# Patient Record
Sex: Female | Born: 1935 | Race: White | Hispanic: No | Smoking: Current every day smoker
Health system: Southern US, Community
[De-identification: ages and names within clinical notes are randomized; demographics above are authoritative.]

## PROBLEM LIST (undated history)

## (undated) DIAGNOSIS — I10 Essential (primary) hypertension: Secondary | ICD-10-CM

## (undated) DIAGNOSIS — S7291XA Unspecified fracture of right femur, initial encounter for closed fracture: Secondary | ICD-10-CM

## (undated) DIAGNOSIS — R1310 Dysphagia, unspecified: Secondary | ICD-10-CM

## (undated) DIAGNOSIS — R251 Tremor, unspecified: Secondary | ICD-10-CM

## (undated) DIAGNOSIS — J189 Pneumonia, unspecified organism: Secondary | ICD-10-CM

## (undated) DIAGNOSIS — E46 Unspecified protein-calorie malnutrition: Secondary | ICD-10-CM

## (undated) DIAGNOSIS — K5900 Constipation, unspecified: Secondary | ICD-10-CM

## (undated) DIAGNOSIS — E876 Hypokalemia: Secondary | ICD-10-CM

## (undated) DIAGNOSIS — K219 Gastro-esophageal reflux disease without esophagitis: Secondary | ICD-10-CM

## (undated) DIAGNOSIS — J449 Chronic obstructive pulmonary disease, unspecified: Secondary | ICD-10-CM

## (undated) DIAGNOSIS — J9600 Acute respiratory failure, unspecified whether with hypoxia or hypercapnia: Secondary | ICD-10-CM

## (undated) DIAGNOSIS — C50919 Malignant neoplasm of unspecified site of unspecified female breast: Secondary | ICD-10-CM

## (undated) DIAGNOSIS — E785 Hyperlipidemia, unspecified: Secondary | ICD-10-CM

## (undated) DIAGNOSIS — R609 Edema, unspecified: Secondary | ICD-10-CM

## (undated) DIAGNOSIS — D689 Coagulation defect, unspecified: Secondary | ICD-10-CM

## (undated) DIAGNOSIS — M419 Scoliosis, unspecified: Secondary | ICD-10-CM

## (undated) HISTORY — DX: Chronic obstructive pulmonary disease, unspecified: J44.9

## (undated) HISTORY — PX: PORTACATH PLACEMENT: SHX2246

## (undated) HISTORY — DX: Edema, unspecified: R60.9

## (undated) HISTORY — DX: Scoliosis, unspecified: M41.9

## (undated) HISTORY — DX: Dysphagia, unspecified: R13.10

## (undated) HISTORY — DX: Hyperlipidemia, unspecified: E78.5

## (undated) HISTORY — DX: Hypokalemia: E87.6

## (undated) HISTORY — DX: Hypomagnesemia: E83.42

## (undated) HISTORY — DX: Essential (primary) hypertension: I10

## (undated) HISTORY — DX: Unspecified fracture of right femur, initial encounter for closed fracture: S72.91XA

## (undated) HISTORY — DX: Coagulation defect, unspecified: D68.9

## (undated) HISTORY — DX: Tremor, unspecified: R25.1

## (undated) HISTORY — PX: OTHER SURGICAL HISTORY: SHX169

## (undated) HISTORY — PX: BACK SURGERY: SHX140

## (undated) HISTORY — DX: Acute respiratory failure, unspecified whether with hypoxia or hypercapnia: J96.00

## (undated) HISTORY — DX: Constipation, unspecified: K59.00

## (undated) HISTORY — DX: Malignant neoplasm of unspecified site of unspecified female breast: C50.919

## (undated) HISTORY — DX: Gastro-esophageal reflux disease without esophagitis: K21.9

## (undated) HISTORY — DX: Pneumonia, unspecified organism: J18.9

## (undated) HISTORY — DX: Unspecified protein-calorie malnutrition: E46

---

## 1997-09-19 DIAGNOSIS — D689 Coagulation defect, unspecified: Secondary | ICD-10-CM

## 1997-09-19 HISTORY — DX: Coagulation defect, unspecified: D68.9

## 1999-09-20 DIAGNOSIS — C50919 Malignant neoplasm of unspecified site of unspecified female breast: Secondary | ICD-10-CM

## 1999-09-20 HISTORY — DX: Malignant neoplasm of unspecified site of unspecified female breast: C50.919

## 2003-09-20 HISTORY — PX: BACK SURGERY: SHX140

## 2003-11-26 ENCOUNTER — Ambulatory Visit (HOSPITAL_COMMUNITY): Admission: RE | Admit: 2003-11-26 | Discharge: 2003-11-27 | Payer: Self-pay | Admitting: *Deleted

## 2004-06-19 ENCOUNTER — Ambulatory Visit: Payer: Self-pay | Admitting: Internal Medicine

## 2004-07-13 ENCOUNTER — Ambulatory Visit: Payer: Self-pay | Admitting: Orthopedic Surgery

## 2004-08-18 ENCOUNTER — Ambulatory Visit: Payer: Self-pay | Admitting: Internal Medicine

## 2004-10-04 ENCOUNTER — Ambulatory Visit: Payer: Self-pay | Admitting: Internal Medicine

## 2004-10-20 ENCOUNTER — Ambulatory Visit: Payer: Self-pay | Admitting: Internal Medicine

## 2004-11-17 ENCOUNTER — Ambulatory Visit: Payer: Self-pay | Admitting: Internal Medicine

## 2004-12-18 ENCOUNTER — Ambulatory Visit: Payer: Self-pay | Admitting: Internal Medicine

## 2005-01-24 ENCOUNTER — Ambulatory Visit: Payer: Self-pay | Admitting: Internal Medicine

## 2005-02-01 ENCOUNTER — Ambulatory Visit: Payer: Self-pay | Admitting: Unknown Physician Specialty

## 2005-02-07 ENCOUNTER — Ambulatory Visit: Payer: Self-pay | Admitting: General Surgery

## 2005-02-17 ENCOUNTER — Ambulatory Visit: Payer: Self-pay | Admitting: Internal Medicine

## 2005-03-19 ENCOUNTER — Ambulatory Visit: Payer: Self-pay | Admitting: Internal Medicine

## 2005-04-19 ENCOUNTER — Ambulatory Visit: Payer: Self-pay | Admitting: Internal Medicine

## 2005-07-19 ENCOUNTER — Ambulatory Visit: Payer: Self-pay | Admitting: Internal Medicine

## 2005-11-15 ENCOUNTER — Ambulatory Visit: Payer: Self-pay | Admitting: Internal Medicine

## 2005-11-17 ENCOUNTER — Ambulatory Visit: Payer: Self-pay | Admitting: Internal Medicine

## 2006-02-13 ENCOUNTER — Ambulatory Visit: Payer: Self-pay | Admitting: Internal Medicine

## 2006-02-17 ENCOUNTER — Ambulatory Visit: Payer: Self-pay | Admitting: Internal Medicine

## 2006-03-19 ENCOUNTER — Ambulatory Visit: Payer: Self-pay | Admitting: Internal Medicine

## 2006-04-03 ENCOUNTER — Ambulatory Visit: Payer: Self-pay | Admitting: Unknown Physician Specialty

## 2006-04-19 ENCOUNTER — Ambulatory Visit: Payer: Self-pay | Admitting: Internal Medicine

## 2006-05-20 ENCOUNTER — Ambulatory Visit: Payer: Self-pay | Admitting: Internal Medicine

## 2006-06-14 ENCOUNTER — Ambulatory Visit: Payer: Self-pay

## 2006-06-19 ENCOUNTER — Ambulatory Visit: Payer: Self-pay | Admitting: Internal Medicine

## 2006-06-22 ENCOUNTER — Ambulatory Visit: Payer: Self-pay | Admitting: Gastroenterology

## 2006-08-25 ENCOUNTER — Ambulatory Visit: Payer: Self-pay | Admitting: Internal Medicine

## 2006-09-19 ENCOUNTER — Ambulatory Visit: Payer: Self-pay | Admitting: Internal Medicine

## 2006-10-20 ENCOUNTER — Ambulatory Visit: Payer: Self-pay | Admitting: Internal Medicine

## 2006-10-26 ENCOUNTER — Ambulatory Visit: Payer: Self-pay

## 2006-11-15 ENCOUNTER — Ambulatory Visit: Payer: Self-pay | Admitting: Dermatology

## 2006-11-15 ENCOUNTER — Ambulatory Visit: Payer: Self-pay

## 2006-11-18 ENCOUNTER — Ambulatory Visit: Payer: Self-pay | Admitting: Internal Medicine

## 2007-01-18 ENCOUNTER — Ambulatory Visit: Payer: Self-pay | Admitting: Internal Medicine

## 2007-02-09 ENCOUNTER — Ambulatory Visit: Payer: Self-pay | Admitting: Internal Medicine

## 2007-02-18 ENCOUNTER — Ambulatory Visit: Payer: Self-pay | Admitting: Internal Medicine

## 2007-06-18 ENCOUNTER — Ambulatory Visit: Payer: Self-pay | Admitting: Unknown Physician Specialty

## 2007-07-02 ENCOUNTER — Ambulatory Visit: Payer: Self-pay | Admitting: Unknown Physician Specialty

## 2007-07-21 ENCOUNTER — Ambulatory Visit: Payer: Self-pay | Admitting: Internal Medicine

## 2007-08-09 ENCOUNTER — Ambulatory Visit: Payer: Self-pay | Admitting: Internal Medicine

## 2007-08-20 ENCOUNTER — Ambulatory Visit: Payer: Self-pay | Admitting: Internal Medicine

## 2007-10-10 ENCOUNTER — Ambulatory Visit: Payer: Self-pay | Admitting: Internal Medicine

## 2007-10-21 ENCOUNTER — Ambulatory Visit: Payer: Self-pay | Admitting: Internal Medicine

## 2007-12-03 ENCOUNTER — Ambulatory Visit: Payer: Self-pay | Admitting: Internal Medicine

## 2007-12-19 ENCOUNTER — Ambulatory Visit: Payer: Self-pay | Admitting: Internal Medicine

## 2008-02-18 ENCOUNTER — Ambulatory Visit: Payer: Self-pay | Admitting: Internal Medicine

## 2008-03-03 ENCOUNTER — Ambulatory Visit: Payer: Self-pay | Admitting: Internal Medicine

## 2008-03-19 ENCOUNTER — Ambulatory Visit: Payer: Self-pay | Admitting: Internal Medicine

## 2008-03-20 ENCOUNTER — Ambulatory Visit: Payer: Self-pay | Admitting: Orthopaedic Surgery

## 2008-04-19 ENCOUNTER — Ambulatory Visit: Payer: Self-pay | Admitting: Internal Medicine

## 2008-06-23 ENCOUNTER — Ambulatory Visit: Payer: Self-pay | Admitting: Unknown Physician Specialty

## 2008-08-19 ENCOUNTER — Ambulatory Visit: Payer: Self-pay | Admitting: Internal Medicine

## 2008-09-09 ENCOUNTER — Ambulatory Visit: Payer: Self-pay | Admitting: Internal Medicine

## 2008-09-19 ENCOUNTER — Ambulatory Visit: Payer: Self-pay | Admitting: Internal Medicine

## 2008-10-20 ENCOUNTER — Ambulatory Visit: Payer: Self-pay | Admitting: Internal Medicine

## 2008-11-03 ENCOUNTER — Encounter: Admission: RE | Admit: 2008-11-03 | Discharge: 2008-11-03 | Payer: Self-pay | Admitting: Orthopaedic Surgery

## 2008-11-17 ENCOUNTER — Ambulatory Visit: Payer: Self-pay | Admitting: Internal Medicine

## 2008-12-18 ENCOUNTER — Ambulatory Visit: Payer: Self-pay | Admitting: Internal Medicine

## 2009-03-03 ENCOUNTER — Ambulatory Visit: Payer: Self-pay | Admitting: Internal Medicine

## 2009-03-13 ENCOUNTER — Ambulatory Visit: Payer: Self-pay | Admitting: Orthopaedic Surgery

## 2009-03-19 ENCOUNTER — Ambulatory Visit: Payer: Self-pay | Admitting: Internal Medicine

## 2009-05-20 ENCOUNTER — Ambulatory Visit: Payer: Self-pay | Admitting: Internal Medicine

## 2009-05-26 ENCOUNTER — Ambulatory Visit: Payer: Self-pay | Admitting: Internal Medicine

## 2009-06-19 ENCOUNTER — Ambulatory Visit: Payer: Self-pay | Admitting: Internal Medicine

## 2009-08-03 ENCOUNTER — Ambulatory Visit: Payer: Self-pay | Admitting: Unknown Physician Specialty

## 2009-11-17 ENCOUNTER — Ambulatory Visit: Payer: Self-pay | Admitting: Internal Medicine

## 2009-11-24 ENCOUNTER — Ambulatory Visit: Payer: Self-pay | Admitting: Internal Medicine

## 2009-12-18 ENCOUNTER — Ambulatory Visit: Payer: Self-pay | Admitting: Internal Medicine

## 2010-01-17 ENCOUNTER — Ambulatory Visit: Payer: Self-pay | Admitting: Internal Medicine

## 2010-05-25 ENCOUNTER — Ambulatory Visit: Payer: Self-pay | Admitting: Internal Medicine

## 2010-06-19 ENCOUNTER — Ambulatory Visit: Payer: Self-pay | Admitting: Internal Medicine

## 2010-08-11 ENCOUNTER — Ambulatory Visit: Payer: Self-pay | Admitting: Unknown Physician Specialty

## 2010-12-02 ENCOUNTER — Ambulatory Visit: Payer: Self-pay | Admitting: Internal Medicine

## 2010-12-19 ENCOUNTER — Ambulatory Visit: Payer: Self-pay | Admitting: Internal Medicine

## 2011-01-18 ENCOUNTER — Ambulatory Visit: Payer: Self-pay | Admitting: Internal Medicine

## 2011-03-04 ENCOUNTER — Ambulatory Visit: Payer: Self-pay | Admitting: Internal Medicine

## 2011-03-15 ENCOUNTER — Inpatient Hospital Stay: Payer: Self-pay | Admitting: Internal Medicine

## 2011-03-20 ENCOUNTER — Ambulatory Visit: Payer: Self-pay | Admitting: Internal Medicine

## 2011-04-20 ENCOUNTER — Ambulatory Visit: Payer: Self-pay | Admitting: Internal Medicine

## 2011-05-21 ENCOUNTER — Ambulatory Visit: Payer: Self-pay | Admitting: Internal Medicine

## 2011-06-20 ENCOUNTER — Ambulatory Visit: Payer: Self-pay | Admitting: Internal Medicine

## 2011-07-21 ENCOUNTER — Ambulatory Visit: Payer: Self-pay | Admitting: Internal Medicine

## 2011-08-24 ENCOUNTER — Ambulatory Visit: Payer: Self-pay | Admitting: Internal Medicine

## 2011-09-20 ENCOUNTER — Ambulatory Visit: Payer: Self-pay | Admitting: Internal Medicine

## 2011-10-24 ENCOUNTER — Ambulatory Visit: Payer: Self-pay | Admitting: Unknown Physician Specialty

## 2011-10-27 ENCOUNTER — Ambulatory Visit: Payer: Self-pay | Admitting: Internal Medicine

## 2011-10-27 LAB — CBC CANCER CENTER
Eosinophil #: 0.5 x10 3/mm (ref 0.0–0.7)
Eosinophil %: 5.4 %
HCT: 40.4 % (ref 35.0–47.0)
HGB: 13.9 g/dL (ref 12.0–16.0)
MCHC: 34.3 g/dL (ref 32.0–36.0)
MCV: 104 fL — ABNORMAL HIGH (ref 80–100)
Monocyte #: 0.9 x10 3/mm — ABNORMAL HIGH (ref 0.0–0.7)
Neutrophil %: 56.8 %
Platelet: 638 x10 3/mm — ABNORMAL HIGH (ref 150–440)
RBC: 3.91 10*6/uL (ref 3.80–5.20)
WBC: 8.5 x10 3/mm (ref 3.6–11.0)

## 2011-11-18 ENCOUNTER — Ambulatory Visit: Payer: Self-pay | Admitting: Internal Medicine

## 2011-11-30 LAB — CBC CANCER CENTER
Basophil #: 0 x10 3/mm (ref 0.0–0.1)
Basophil %: 0.4 %
Eosinophil #: 0.3 x10 3/mm (ref 0.0–0.7)
Eosinophil %: 2.1 %
HCT: 38.6 % (ref 35.0–47.0)
HGB: 13.5 g/dL (ref 12.0–16.0)
Lymphocyte #: 1.9 x10 3/mm (ref 1.0–3.6)
MCH: 35.8 pg — ABNORMAL HIGH (ref 26.0–34.0)
MCHC: 34.9 g/dL (ref 32.0–36.0)
MCV: 103 fL — ABNORMAL HIGH (ref 80–100)
Monocyte %: 8.1 %
Neutrophil #: 9.4 x10 3/mm — ABNORMAL HIGH (ref 1.4–6.5)
Neutrophil %: 74.5 %
RBC: 3.76 10*6/uL — ABNORMAL LOW (ref 3.80–5.20)

## 2011-12-19 ENCOUNTER — Ambulatory Visit: Payer: Self-pay | Admitting: Internal Medicine

## 2012-01-06 LAB — CBC CANCER CENTER
Basophil #: 0.2 x10 3/mm — ABNORMAL HIGH (ref 0.0–0.1)
Eosinophil #: 0.3 x10 3/mm (ref 0.0–0.7)
HCT: 39.1 % (ref 35.0–47.0)
HGB: 13.3 g/dL (ref 12.0–16.0)
Lymphocyte #: 2.2 x10 3/mm (ref 1.0–3.6)
MCV: 104 fL — ABNORMAL HIGH (ref 80–100)
Monocyte #: 1 x10 3/mm — ABNORMAL HIGH (ref 0.2–0.9)
Monocyte %: 9.1 %
RBC: 3.76 10*6/uL — ABNORMAL LOW (ref 3.80–5.20)
RDW: 13.7 % (ref 11.5–14.5)
WBC: 10.8 x10 3/mm (ref 3.6–11.0)

## 2012-01-06 LAB — CREATININE, SERUM
Creatinine: 1.28 mg/dL (ref 0.60–1.30)
EGFR (African American): 48 — ABNORMAL LOW
EGFR (Non-African Amer.): 41 — ABNORMAL LOW

## 2012-01-18 ENCOUNTER — Ambulatory Visit: Payer: Self-pay | Admitting: Internal Medicine

## 2012-01-26 LAB — CBC CANCER CENTER
Basophil %: 0.9 %
Eosinophil #: 0.2 x10 3/mm (ref 0.0–0.7)
Eosinophil %: 2.7 %
Lymphocyte #: 2 x10 3/mm (ref 1.0–3.6)
Lymphocyte %: 22 %
MCH: 35.5 pg — ABNORMAL HIGH (ref 26.0–34.0)
MCHC: 33.8 g/dL (ref 32.0–36.0)
Monocyte #: 1 x10 3/mm — ABNORMAL HIGH (ref 0.2–0.9)
Monocyte %: 11.7 %
Neutrophil #: 5.6 x10 3/mm (ref 1.4–6.5)
Platelet: 440 x10 3/mm (ref 150–440)
RBC: 3.7 10*6/uL — ABNORMAL LOW (ref 3.80–5.20)
WBC: 8.9 x10 3/mm (ref 3.6–11.0)

## 2012-01-26 LAB — CREATININE, SERUM
Creatinine: 1.08 mg/dL (ref 0.60–1.30)
EGFR (African American): 59 — ABNORMAL LOW
EGFR (Non-African Amer.): 51 — ABNORMAL LOW

## 2012-02-18 ENCOUNTER — Ambulatory Visit: Payer: Self-pay | Admitting: Internal Medicine

## 2012-02-24 LAB — CBC CANCER CENTER
Basophil #: 0.1 10*3/uL
Basophil %: 1 %
Eosinophil #: 0.3 10*3/uL
Eosinophil %: 2.8 %
HCT: 39.5 %
HGB: 13.8 g/dL
Lymphocyte %: 24.5 %
Lymphs Abs: 2.6 10*3/uL
MCH: 36.4 pg — ABNORMAL HIGH
MCHC: 35 g/dL
MCV: 104 fL — ABNORMAL HIGH
Monocyte #: 1 10*3/uL — ABNORMAL HIGH
Monocyte %: 9.6 %
Neutrophil #: 6.5 10*3/uL
Neutrophil %: 62.1 %
Platelet: 583 10*3/uL — ABNORMAL HIGH
RBC: 3.8 10*6/uL
RDW: 13.4 %
WBC: 10.5 10*3/uL

## 2012-03-19 ENCOUNTER — Ambulatory Visit: Payer: Self-pay | Admitting: Internal Medicine

## 2012-04-18 LAB — CBC CANCER CENTER
Basophil #: 0.1 x10 3/mm (ref 0.0–0.1)
Basophil %: 1 %
Eosinophil %: 3.4 %
HCT: 37.7 % (ref 35.0–47.0)
HGB: 13.2 g/dL (ref 12.0–16.0)
Lymphocyte #: 2.5 x10 3/mm (ref 1.0–3.6)
MCHC: 35.1 g/dL (ref 32.0–36.0)
MCV: 103 fL — ABNORMAL HIGH (ref 80–100)
Monocyte %: 9.8 %
Neutrophil #: 6.9 x10 3/mm — ABNORMAL HIGH (ref 1.4–6.5)
Neutrophil %: 62.9 %
RDW: 13 % (ref 11.5–14.5)

## 2012-04-19 ENCOUNTER — Ambulatory Visit: Payer: Self-pay | Admitting: Internal Medicine

## 2012-05-09 LAB — CBC CANCER CENTER
Basophil #: 0.1 x10 3/mm (ref 0.0–0.1)
Basophil %: 1.4 %
HGB: 13.2 g/dL (ref 12.0–16.0)
Lymphocyte #: 2.6 x10 3/mm (ref 1.0–3.6)
Lymphocyte %: 25 %
MCH: 35 pg — ABNORMAL HIGH (ref 26.0–34.0)
MCHC: 33.9 g/dL (ref 32.0–36.0)
MCV: 103 fL — ABNORMAL HIGH (ref 80–100)
Monocyte #: 1.1 x10 3/mm — ABNORMAL HIGH (ref 0.2–0.9)
Neutrophil #: 6.2 x10 3/mm (ref 1.4–6.5)
Neutrophil %: 60.2 %
RBC: 3.76 10*6/uL — ABNORMAL LOW (ref 3.80–5.20)
RDW: 13.2 % (ref 11.5–14.5)

## 2012-05-10 LAB — URINE IEP, RANDOM

## 2012-05-20 ENCOUNTER — Ambulatory Visit: Payer: Self-pay | Admitting: Internal Medicine

## 2012-06-13 LAB — CANCER CTR PLATELET CT: Platelet: 471 x10 3/mm — ABNORMAL HIGH (ref 150–440)

## 2012-06-19 ENCOUNTER — Ambulatory Visit: Payer: Self-pay | Admitting: Internal Medicine

## 2012-08-08 ENCOUNTER — Ambulatory Visit: Payer: Self-pay | Admitting: Internal Medicine

## 2012-08-08 LAB — CBC CANCER CENTER
Basophil %: 1.1 %
Eosinophil %: 3.9 %
HGB: 13.2 g/dL (ref 12.0–16.0)
Lymphocyte #: 2.1 x10 3/mm (ref 1.0–3.6)
MCV: 104 fL — ABNORMAL HIGH (ref 80–100)
Monocyte #: 1 x10 3/mm — ABNORMAL HIGH (ref 0.2–0.9)
Monocyte %: 9.4 %
Neutrophil #: 6.6 x10 3/mm — ABNORMAL HIGH (ref 1.4–6.5)
Neutrophil %: 65.4 %
Platelet: 393 x10 3/mm (ref 150–440)
RBC: 3.7 10*6/uL — ABNORMAL LOW (ref 3.80–5.20)
WBC: 10.2 x10 3/mm (ref 3.6–11.0)

## 2012-08-19 ENCOUNTER — Ambulatory Visit: Payer: Self-pay | Admitting: Internal Medicine

## 2012-09-19 DIAGNOSIS — C50919 Malignant neoplasm of unspecified site of unspecified female breast: Secondary | ICD-10-CM

## 2012-09-19 HISTORY — DX: Malignant neoplasm of unspecified site of unspecified female breast: C50.919

## 2012-09-19 HISTORY — PX: BREAST BIOPSY: SHX20

## 2012-10-24 ENCOUNTER — Ambulatory Visit: Payer: Self-pay | Admitting: Internal Medicine

## 2012-10-24 LAB — CBC CANCER CENTER
Basophil #: 0.1 x10 3/mm (ref 0.0–0.1)
Basophil %: 1.1 %
Eosinophil #: 0.3 x10 3/mm (ref 0.0–0.7)
Eosinophil %: 3.5 %
Lymphocyte #: 1.8 x10 3/mm (ref 1.0–3.6)
Lymphocyte %: 19.9 %
MCH: 35.9 pg — ABNORMAL HIGH (ref 26.0–34.0)
MCV: 101 fL — ABNORMAL HIGH (ref 80–100)
Neutrophil #: 6.2 x10 3/mm (ref 1.4–6.5)
Neutrophil %: 66.5 %
RBC: 3.83 10*6/uL (ref 3.80–5.20)
RDW: 13.1 % (ref 11.5–14.5)
WBC: 9.3 x10 3/mm (ref 3.6–11.0)

## 2012-10-24 LAB — COMPREHENSIVE METABOLIC PANEL
Albumin: 3.9 g/dL (ref 3.4–5.0)
Alkaline Phosphatase: 133 U/L (ref 50–136)
Anion Gap: 6 — ABNORMAL LOW (ref 7–16)
BUN: 20 mg/dL — ABNORMAL HIGH (ref 7–18)
Co2: 29 mmol/L (ref 21–32)
Creatinine: 1.14 mg/dL (ref 0.60–1.30)
EGFR (African American): 55 — ABNORMAL LOW
EGFR (Non-African Amer.): 47 — ABNORMAL LOW
Glucose: 102 mg/dL — ABNORMAL HIGH (ref 65–99)
SGPT (ALT): 26 U/L (ref 12–78)
Sodium: 128 mmol/L — ABNORMAL LOW (ref 136–145)
Total Protein: 7.4 g/dL (ref 6.4–8.2)

## 2012-10-25 ENCOUNTER — Ambulatory Visit: Payer: Self-pay | Admitting: Unknown Physician Specialty

## 2012-10-25 ENCOUNTER — Ambulatory Visit: Payer: Self-pay | Admitting: Internal Medicine

## 2012-10-26 ENCOUNTER — Ambulatory Visit: Payer: Self-pay | Admitting: Unknown Physician Specialty

## 2012-10-26 LAB — CREATININE, SERUM
EGFR (African American): 50 — ABNORMAL LOW
EGFR (Non-African Amer.): 43 — ABNORMAL LOW

## 2012-10-29 LAB — BASIC METABOLIC PANEL
BUN: 16 mg/dL (ref 7–18)
Chloride: 90 mmol/L — ABNORMAL LOW (ref 98–107)
Co2: 30 mmol/L (ref 21–32)
Creatinine: 1.24 mg/dL (ref 0.60–1.30)
EGFR (African American): 50 — ABNORMAL LOW
Glucose: 88 mg/dL (ref 65–99)
Osmolality: 258 (ref 275–301)
Potassium: 3.4 mmol/L — ABNORMAL LOW (ref 3.5–5.1)
Sodium: 128 mmol/L — ABNORMAL LOW (ref 136–145)

## 2012-11-06 ENCOUNTER — Ambulatory Visit: Payer: Self-pay | Admitting: Internal Medicine

## 2012-11-08 LAB — CREATININE, SERUM
Creatinine: 1.22 mg/dL (ref 0.60–1.30)
EGFR (African American): 51 — ABNORMAL LOW
EGFR (Non-African Amer.): 44 — ABNORMAL LOW

## 2012-11-08 LAB — PROTIME-INR
INR: 0.9
Prothrombin Time: 12.7 secs (ref 11.5–14.7)

## 2012-11-08 LAB — APTT: Activated PTT: 33.5 secs (ref 23.6–35.9)

## 2012-11-17 ENCOUNTER — Ambulatory Visit: Payer: Self-pay | Admitting: Internal Medicine

## 2012-11-20 ENCOUNTER — Ambulatory Visit: Payer: Self-pay | Admitting: Internal Medicine

## 2012-11-20 LAB — PLATELET COUNT: Platelet: 551 10*3/uL — ABNORMAL HIGH (ref 150–440)

## 2012-11-21 ENCOUNTER — Ambulatory Visit: Payer: Self-pay | Admitting: Internal Medicine

## 2012-12-06 ENCOUNTER — Other Ambulatory Visit (INDEPENDENT_AMBULATORY_CARE_PROVIDER_SITE_OTHER): Payer: Self-pay

## 2012-12-06 ENCOUNTER — Encounter: Payer: Self-pay | Admitting: General Surgery

## 2012-12-06 ENCOUNTER — Ambulatory Visit (INDEPENDENT_AMBULATORY_CARE_PROVIDER_SITE_OTHER): Payer: Medicare Other | Admitting: General Surgery

## 2012-12-06 VITALS — BP 146/70 | HR 72 | Resp 12 | Ht 64.0 in | Wt 132.0 lb

## 2012-12-06 DIAGNOSIS — N6320 Unspecified lump in the left breast, unspecified quadrant: Secondary | ICD-10-CM

## 2012-12-06 DIAGNOSIS — N63 Unspecified lump in unspecified breast: Secondary | ICD-10-CM

## 2012-12-06 NOTE — Progress Notes (Addendum)
Patient ID: Christine Strickland, female   DOB: 1935-09-21, 77 y.o.   MRN: [id number]  Chief Complaint  Patient presents with  . Breast Pain    and breast lump    HPI Christine Strickland is a 77 y.o. female.   HPI Patient here today for evaluation of her left breast mass and left neck mass discovered by Dr Neale Burly at least 2 months ago.  PET scan and CXR done as well as axillary biopsy. The patient had noticed a mass in the left side of the neck. She was evaluated by Erline Hau, M.D. Of the ENT Department. She was subtotally referred to Wyatt Haste,  M.D.from medical oncology. A PET CT was obtained that showed high level activity in the upper outer quadrant of the left breast, left axilla as well as the index mass in the left supraclavicular area. She underwent a biopsy of the axilla on November 20, 2012 with findings of metastatic  Carcinoma, but insignificant tissue to make an diagnosis. An invasive mammary carcinoma of no specific type was suspected. The patient is seen today to discuss having a biopsy of the breast mass  To establish a tissue diagnosis. She was accompanied for the initial visit by her 2 sons, [name] and [name].  Past Medical History  Diagnosis Date  . Breast cancer 2001  . Clotting disorder 1999    platlets  . Scoliosis     Past Surgical History  Procedure Laterality Date  . Breast surgery    . Back surgery  2005  . Back surgery      History reviewed. No pertinent family history.  Social History History  Substance Use Topics  . Smoking status: Current Every Day Smoker -- 0.05 packs/day for 10 years  . Smokeless tobacco: Never Used  . Alcohol Use: 8.4 oz/week    14 Glasses of wine per week    No Known Allergies  Current Outpatient Prescriptions  Medication Sig Dispense Refill  . amLODipine (NORVASC) 5 MG tablet Take 1 tablet by mouth daily.      Marland Kitchen anagrelide (AGRYLIN) 0.5 MG capsule Take 0.5 mg by mouth daily.      . Ascorbic Acid (VITAMIN C PO) Take 1 tablet by mouth daily.       . Biotin 1000 MCG tablet Take 1,000 mcg by mouth daily.      . CELEBREX 200 MG capsule Take 1 capsule by mouth daily.      . metoprolol succinate (TOPROL-XL) 50 MG 24 hr tablet Take 1 tablet by mouth daily.      . traMADol (ULTRAM) 50 MG tablet Take 1 tablet by mouth daily.      . vitamin E (VITAMIN E) 400 UNIT capsule Take 400 Units by mouth daily.       No current facility-administered medications for this visit.    Review of Systems Review of Systems  Constitutional: Negative.   Respiratory: Negative.   Cardiovascular: Negative.   She does complain of some left neck "soreness" but denies breast pain.  Denies nipple drainage.  Blood pressure 146/70, pulse 72, resp. rate 12, height 5\' 4"  (1.626 m), weight 132 lb (59.875 kg).  Physical Exam Physical Exam  Constitutional: She is oriented to person, place, and time. She appears well-developed.  Cardiovascular: Normal rate and regular rhythm.   Pulmonary/Chest: Effort normal and breath sounds normal. Right breast exhibits no inverted nipple, no nipple discharge, no skin change and no tenderness. Left breast exhibits mass. Left breast exhibits no inverted nipple  and no tenderness.    Lymphadenopathy:    She has axillary adenopathy.       Left: Supraclavicular adenopathy present.  Neurological: She is alert and oriented to person, place, and time.  Skin: Skin is warm and dry.  Right breast scaring upper outer quadrant. 2 cm upper outer quadrant left breast.   Data Reviewed Pathology from the patient's right breast cancer showed a lobular carcinoma 3 cm in diameter, node negative, ER positive.  PET/CT of November 06, 2012 was reviewed. Platelet count November 20, 2012 was 551,000 with normal coagulation studies obtained February 20, creatinine 1.22 with an estimated GFR of 44 the same date. Normal CA 27-29. Mild hyponatremia hyperal K. Nedra Hai me appeared  Ultrasound examination of the upper outer quadrant of the left breast showed a  hypoechoic mass that was taller than wide measuring 0.9 x 1.1 x 1.2 cm. Some acoustic shadowing was noted.  The patient was amenable to biopsy. This was completed using 10 cc of 0.5% Xylocaine with 0.25% Marcaine with 1-200,000 of epinephrine. A 10-gauge Encor device was used and a core samples obtained. No bleeding was noted. The skin defect was closed with benzoin and Steri-Strips followed by Telfa and Tegaderm dressing.  Assessment    Metastatic breast cancer.     Plan    The patient will be contacted when the pathology is available.        Earline Mayotte 12/07/2012, 12:25 PM

## 2012-12-06 NOTE — Patient Instructions (Addendum)

## 2012-12-07 NOTE — Procedures (Signed)
Ultrasound examination of the upper outer quadrant of the left breast showed a hypoechoic mass that was taller than wide measuring 0.9 x 1.1 x 1.2 cm. Some acoustic shadowing was noted.  The patient was amenable to biopsy. This was completed using 10 cc of 0.5% Xylocaine with 0.25% Marcaine with 1-200,000 of epinephrine. A 10-gauge Encor device was used and a core samples obtained. No bleeding was noted. The skin defect was closed with benzoin and Steri-Strips followed by Telfa and Tegaderm dressing.

## 2012-12-07 NOTE — Addendum Note (Signed)
Addended by: Earline Mayotte on: 12/07/2012 12:26 PM   Modules accepted: Orders

## 2012-12-13 ENCOUNTER — Ambulatory Visit (INDEPENDENT_AMBULATORY_CARE_PROVIDER_SITE_OTHER): Payer: Medicare Other | Admitting: *Deleted

## 2012-12-13 DIAGNOSIS — N63 Unspecified lump in unspecified breast: Secondary | ICD-10-CM

## 2012-12-13 NOTE — Progress Notes (Signed)
Pathology reviewed:   PATH REPORT.SITE OF ORIGIN SPEC  Comment   Comments: Material submitted: Marland Kitchen LEFT BREAST 2:00 *STAT* PATH REPORT.FINAL DX SPEC  Comment   Comments: Clinician provided ICD-9: 611.72 ; Lump or mass in breast PATH REPORT.FINAL DX SPEC  Comment   Comments:   Diagnosis: LEFT BREAST 2:00 *STAT*: - INVASIVE MAMMARY CARCINOMA, NO SPECIAL TYPE, WITH CANCERIZATION OF LOBULES, SEE COMMENT. Comment: The largest linear focus of invasive carcinoma measures 0.3 cm in this material. Although the final grade should be assigned on the excision specimen, the findings correspond to a Nottingham grade of 3 (tubule formation score 3, nuclear pleomorphism score 3, mitotic count score 2, total score 8/9). ER / PR will be assessed by immunohistochemistry and Her 2 FISH will be performed. Results will be reported separately. Intradepartmental consultation was obtained. The results of this case were discussed with Dr. Lemar Livings on December 10, 2012 by Dr. Excell Seltzer. XDB/12/10/2012   SIGNED OUT BY:  Comment   Comments: Electronically signed: . Gilman Schmidt, MD, Pathologist  Results have been discussed with the patient and Dr. Doylene Canning.

## 2012-12-13 NOTE — Progress Notes (Signed)
Patient here today for follow up post breast biopsy.  Dressing removed, steristrip in place and aware it may come off in one week.  Minimal bruising noted.  The patient is aware that a heating pad may be used for comfort as needed.  Aware of pathology. Follow up as scheduled.  

## 2012-12-17 ENCOUNTER — Encounter: Payer: Self-pay | Admitting: General Surgery

## 2012-12-18 ENCOUNTER — Ambulatory Visit: Payer: Self-pay | Admitting: Internal Medicine

## 2012-12-18 ENCOUNTER — Encounter: Payer: Self-pay | Admitting: General Surgery

## 2012-12-21 ENCOUNTER — Telehealth: Payer: Self-pay | Admitting: *Deleted

## 2012-12-21 NOTE — Telephone Encounter (Signed)
Collette at the [facility] called and said Dr. Neale Burly wants to get patient set up for a port placement as soon as possible. Will you need to see the patient pre-op or should I just go ahead and arrange? Thanks.

## 2012-12-24 NOTE — Telephone Encounter (Signed)
Per Dr. Lemar Livings, we can go ahead and arrange for port placement. This has been scheduled at [facility] for 12-27-12. She is aware of instructions. Also, Collette at the [facility] is aware of date.

## 2012-12-25 ENCOUNTER — Other Ambulatory Visit: Payer: Self-pay | Admitting: General Surgery

## 2012-12-25 DIAGNOSIS — C50912 Malignant neoplasm of unspecified site of left female breast: Secondary | ICD-10-CM

## 2012-12-25 NOTE — Telephone Encounter (Signed)
The power port procedure was discussed with the patient by phone. Complications (pneumothorax, vascular injury, infection, etc) reviewed.  Patient comfortable to proceed with power port placement. With extensive left sided tumor, will plan for right side port placement.

## 2012-12-27 ENCOUNTER — Ambulatory Visit: Payer: Self-pay | Admitting: General Surgery

## 2012-12-27 ENCOUNTER — Encounter: Payer: Self-pay | Admitting: General Surgery

## 2012-12-27 DIAGNOSIS — C50919 Malignant neoplasm of unspecified site of unspecified female breast: Secondary | ICD-10-CM

## 2012-12-31 ENCOUNTER — Encounter: Payer: Self-pay | Admitting: General Surgery

## 2013-01-01 LAB — CBC CANCER CENTER
Basophil %: 1.1 %
Eosinophil #: 0.3 x10 3/mm (ref 0.0–0.7)
Eosinophil %: 3.2 %
HCT: 34.8 % — ABNORMAL LOW (ref 35.0–47.0)
HGB: 12.2 g/dL (ref 12.0–16.0)
Lymphocyte #: 2.3 x10 3/mm (ref 1.0–3.6)
MCH: 36.6 pg — ABNORMAL HIGH (ref 26.0–34.0)
MCHC: 35 g/dL (ref 32.0–36.0)
MCV: 105 fL — ABNORMAL HIGH (ref 80–100)
Monocyte #: 0.9 x10 3/mm (ref 0.2–0.9)
Monocyte %: 9.3 %
Neutrophil #: 5.8 x10 3/mm (ref 1.4–6.5)
Neutrophil %: 62.1 %
Platelet: 415 x10 3/mm (ref 150–440)
RBC: 3.33 10*6/uL — ABNORMAL LOW (ref 3.80–5.20)
RDW: 14.4 % (ref 11.5–14.5)
WBC: 9.4 x10 3/mm (ref 3.6–11.0)

## 2013-01-01 LAB — COMPREHENSIVE METABOLIC PANEL
Albumin: 3.2 g/dL — ABNORMAL LOW (ref 3.4–5.0)
Anion Gap: 10 (ref 7–16)
Chloride: 96 mmol/L — ABNORMAL LOW (ref 98–107)
Co2: 27 mmol/L (ref 21–32)
Creatinine: 1.25 mg/dL (ref 0.60–1.30)
EGFR (Non-African Amer.): 42 — ABNORMAL LOW
Osmolality: 267 (ref 275–301)

## 2013-01-01 LAB — MAGNESIUM: Magnesium: 1.9 mg/dL

## 2013-01-07 LAB — CBC CANCER CENTER
Basophil #: 0 x10 3/mm (ref 0.0–0.1)
Basophil %: 1.5 %
Eosinophil #: 0 x10 3/mm (ref 0.0–0.7)
Eosinophil %: 3.2 %
HGB: 11.3 g/dL — ABNORMAL LOW (ref 12.0–16.0)
Lymphocyte #: 0.6 x10 3/mm — ABNORMAL LOW (ref 1.0–3.6)
MCH: 37.5 pg — ABNORMAL HIGH (ref 26.0–34.0)
MCV: 107 fL — ABNORMAL HIGH (ref 80–100)
Monocyte #: 0 x10 3/mm — ABNORMAL LOW (ref 0.2–0.9)
Neutrophil %: 29.1 %
Platelet: 149 x10 3/mm — ABNORMAL LOW (ref 150–440)
RBC: 3.01 10*6/uL — ABNORMAL LOW (ref 3.80–5.20)
RDW: 14 % (ref 11.5–14.5)

## 2013-01-07 LAB — POTASSIUM: Potassium: 4.5 mmol/L (ref 3.5–5.1)

## 2013-01-09 LAB — CBC CANCER CENTER
Basophil #: 0 x10 3/mm (ref 0.0–0.1)
Basophil %: 0 %
Eosinophil %: 6.6 %
HCT: 29.1 % — ABNORMAL LOW (ref 35.0–47.0)
HGB: 10.2 g/dL — ABNORMAL LOW (ref 12.0–16.0)
Lymphocyte #: 0.2 x10 3/mm — ABNORMAL LOW (ref 1.0–3.6)
Lymphocyte %: 77 %
MCH: 37.2 pg — ABNORMAL HIGH (ref 26.0–34.0)
MCHC: 35.1 g/dL (ref 32.0–36.0)
Monocyte %: 10 %
Neutrophil #: 0 x10 3/mm — ABNORMAL LOW (ref 1.4–6.5)
Platelet: 63 x10 3/mm — ABNORMAL LOW (ref 150–440)
RBC: 2.75 10*6/uL — ABNORMAL LOW (ref 3.80–5.20)
RDW: 13.9 % (ref 11.5–14.5)
WBC: 0.3 x10 3/mm — CL (ref 3.6–11.0)

## 2013-01-11 LAB — CBC CANCER CENTER
Bands: 17 %
Basophil: 2 %
Eosinophil: 2 %
HCT: 28.7 % — ABNORMAL LOW (ref 35.0–47.0)
HGB: 10.1 g/dL — ABNORMAL LOW (ref 12.0–16.0)
Lymphocytes: 20 %
MCH: 36.9 pg — ABNORMAL HIGH (ref 26.0–34.0)
MCHC: 35.1 g/dL (ref 32.0–36.0)
Metamyelocyte: 1 %
Platelet: 60 x10 3/mm — ABNORMAL LOW (ref 150–440)
RBC: 2.74 10*6/uL — ABNORMAL LOW (ref 3.80–5.20)
RDW: 13.6 % (ref 11.5–14.5)
WBC: 2.3 x10 3/mm — ABNORMAL LOW (ref 3.6–11.0)

## 2013-01-17 ENCOUNTER — Ambulatory Visit: Payer: Self-pay | Admitting: Internal Medicine

## 2013-01-23 LAB — COMPREHENSIVE METABOLIC PANEL
Albumin: 2.6 g/dL — ABNORMAL LOW (ref 3.4–5.0)
Alkaline Phosphatase: 93 U/L (ref 50–136)
Anion Gap: 7 (ref 7–16)
BUN: 10 mg/dL (ref 7–18)
Bilirubin,Total: <0.1 mg/dL — ABNORMAL LOW (ref 0.2–1.0)
Chloride: 101 mmol/L (ref 98–107)
Co2: 26 mmol/L (ref 21–32)
EGFR (Non-African Amer.): 52 — ABNORMAL LOW
Glucose: 103 mg/dL — ABNORMAL HIGH (ref 65–99)
Sodium: 134 mmol/L — ABNORMAL LOW (ref 136–145)
Total Protein: 5.9 g/dL — ABNORMAL LOW (ref 6.4–8.2)

## 2013-01-23 LAB — CBC CANCER CENTER
Basophil #: 0.3 x10 3/mm — ABNORMAL HIGH (ref 0.0–0.1)
Eosinophil #: 0.1 x10 3/mm (ref 0.0–0.7)
Eosinophil %: 0.7 %
HGB: 9.4 g/dL — ABNORMAL LOW (ref 12.0–16.0)
Lymphocyte #: 1.2 x10 3/mm (ref 1.0–3.6)
MCHC: 35.4 g/dL (ref 32.0–36.0)
MCV: 104 fL — ABNORMAL HIGH (ref 80–100)
Monocyte #: 1.4 x10 3/mm — ABNORMAL HIGH (ref 0.2–0.9)
Platelet: 1011 x10 3/mm — ABNORMAL HIGH (ref 150–440)
RBC: 2.57 10*6/uL — ABNORMAL LOW (ref 3.80–5.20)
RDW: 13.8 % (ref 11.5–14.5)
WBC: 9.4 x10 3/mm (ref 3.6–11.0)

## 2013-01-28 LAB — CBC CANCER CENTER
Basophil #: 0 x10 3/mm (ref 0.0–0.1)
Basophil %: 0.5 %
Eosinophil #: 0.1 x10 3/mm (ref 0.0–0.7)
Eosinophil %: 1.1 %
HCT: 26.3 % — ABNORMAL LOW (ref 35.0–47.0)
Lymphocyte #: 0.5 x10 3/mm — ABNORMAL LOW (ref 1.0–3.6)
MCH: 37 pg — ABNORMAL HIGH (ref 26.0–34.0)
MCHC: 35.2 g/dL (ref 32.0–36.0)
MCV: 105 fL — ABNORMAL HIGH (ref 80–100)
Monocyte #: 0.1 x10 3/mm — ABNORMAL LOW (ref 0.2–0.9)
Monocyte %: 0.8 %
Neutrophil %: 91.9 %
Platelet: 302 x10 3/mm (ref 150–440)
RDW: 13.8 % (ref 11.5–14.5)
WBC: 9.5 x10 3/mm (ref 3.6–11.0)

## 2013-01-31 LAB — CBC CANCER CENTER
Lymphocyte %: 54.1 %
MCH: 36.8 pg — ABNORMAL HIGH (ref 26.0–34.0)
MCV: 102 fL — ABNORMAL HIGH (ref 80–100)
Monocyte #: 0.1 x10 3/mm — ABNORMAL LOW (ref 0.2–0.9)
Platelet: 91 x10 3/mm — ABNORMAL LOW (ref 150–440)
RBC: 2.42 10*6/uL — ABNORMAL LOW (ref 3.80–5.20)
RDW: 13.3 % (ref 11.5–14.5)
WBC: 0.7 x10 3/mm — CL (ref 3.6–11.0)

## 2013-01-31 LAB — POTASSIUM: Potassium: 4 mmol/L (ref 3.5–5.1)

## 2013-02-04 LAB — CBC CANCER CENTER
Basophil #: 0.1 x10 3/mm (ref 0.0–0.1)
Basophil %: 0.8 %
HCT: 23.7 % — ABNORMAL LOW (ref 35.0–47.0)
HGB: 8.4 g/dL — ABNORMAL LOW (ref 12.0–16.0)
Lymphocyte #: 0.8 x10 3/mm — ABNORMAL LOW (ref 1.0–3.6)
Lymphocyte %: 8 %
MCV: 101 fL — ABNORMAL HIGH (ref 80–100)
Monocyte #: 1.1 x10 3/mm — ABNORMAL HIGH (ref 0.2–0.9)
Platelet: 175 x10 3/mm (ref 150–440)
RBC: 2.34 10*6/uL — ABNORMAL LOW (ref 3.80–5.20)
RDW: 13.3 % (ref 11.5–14.5)

## 2013-02-13 LAB — COMPREHENSIVE METABOLIC PANEL
Albumin: 3.1 g/dL — ABNORMAL LOW (ref 3.4–5.0)
Anion Gap: 9 (ref 7–16)
Bilirubin,Total: 0.3 mg/dL (ref 0.2–1.0)
Calcium, Total: 9 mg/dL (ref 8.5–10.1)
Chloride: 96 mmol/L — ABNORMAL LOW (ref 98–107)
Co2: 27 mmol/L (ref 21–32)
EGFR (Non-African Amer.): 49 — ABNORMAL LOW
Osmolality: 264 (ref 275–301)
SGPT (ALT): 17 U/L (ref 12–78)

## 2013-02-13 LAB — CBC CANCER CENTER
Basophil #: 0.1 x10 3/mm (ref 0.0–0.1)
Basophil %: 0.5 %
Eosinophil %: 0.7 %
HGB: 9.5 g/dL — ABNORMAL LOW (ref 12.0–16.0)
Lymphocyte #: 1.3 x10 3/mm (ref 1.0–3.6)
Lymphocyte %: 12.5 %
MCHC: 36.5 g/dL — ABNORMAL HIGH (ref 32.0–36.0)
MCV: 101 fL — ABNORMAL HIGH (ref 80–100)
Monocyte %: 14.4 %
Neutrophil %: 71.9 %
Platelet: 772 x10 3/mm — ABNORMAL HIGH (ref 150–440)
RBC: 2.59 10*6/uL — ABNORMAL LOW (ref 3.80–5.20)
RDW: 14.5 % (ref 11.5–14.5)
WBC: 10.2 x10 3/mm (ref 3.6–11.0)

## 2013-02-13 LAB — MAGNESIUM: Magnesium: 1.6 mg/dL — ABNORMAL LOW

## 2013-02-17 ENCOUNTER — Ambulatory Visit: Payer: Self-pay | Admitting: Internal Medicine

## 2013-02-18 LAB — CBC CANCER CENTER
Bands: 2 %
RBC: 2.4 10*6/uL — ABNORMAL LOW (ref 3.80–5.20)
RDW: 14.3 % (ref 11.5–14.5)
Segmented Neutrophils: 92 %
WBC: 10 x10 3/mm (ref 3.6–11.0)

## 2013-02-18 LAB — POTASSIUM: Potassium: 4 mmol/L (ref 3.5–5.1)

## 2013-02-18 LAB — MAGNESIUM: Magnesium: 1.8 mg/dL

## 2013-02-22 LAB — CBC CANCER CENTER
Basophil #: 0 x10 3/mm (ref 0.0–0.1)
Basophil %: 0 %
Eosinophil %: 15.1 %
MCHC: 36.1 g/dL — ABNORMAL HIGH (ref 32.0–36.0)
MCV: 102 fL — ABNORMAL HIGH (ref 80–100)
Monocyte #: 0 x10 3/mm — ABNORMAL LOW (ref 0.2–0.9)
Monocyte %: 2 %
RBC: 2.01 10*6/uL — ABNORMAL LOW (ref 3.80–5.20)
RDW: 13.6 % (ref 11.5–14.5)
WBC: 0.6 x10 3/mm — CL (ref 3.6–11.0)

## 2013-02-22 LAB — POTASSIUM: Potassium: 3.6 mmol/L (ref 3.5–5.1)

## 2013-02-25 LAB — CBC CANCER CENTER
Basophil #: 0.1 x10 3/mm (ref 0.0–0.1)
Basophil %: 2 %
Eosinophil #: 0.1 x10 3/mm (ref 0.0–0.7)
Lymphocyte #: 0.5 x10 3/mm — ABNORMAL LOW (ref 1.0–3.6)
MCH: 36.4 pg — ABNORMAL HIGH (ref 26.0–34.0)
MCHC: 36.5 g/dL — ABNORMAL HIGH (ref 32.0–36.0)
Monocyte #: 0.4 x10 3/mm (ref 0.2–0.9)
Monocyte %: 14 %
Neutrophil #: 1.9 x10 3/mm (ref 1.4–6.5)
WBC: 2.9 x10 3/mm — ABNORMAL LOW (ref 3.6–11.0)

## 2013-02-25 LAB — MAGNESIUM: Magnesium: 1.6 mg/dL — ABNORMAL LOW

## 2013-02-25 LAB — POTASSIUM: Potassium: 3.1 mmol/L — ABNORMAL LOW (ref 3.5–5.1)

## 2013-02-27 LAB — CBC CANCER CENTER
Basophil #: 0 x10 3/mm (ref 0.0–0.1)
Basophil %: 0.6 %
Eosinophil #: 0.1 x10 3/mm (ref 0.0–0.7)
HGB: 9.8 g/dL — ABNORMAL LOW (ref 12.0–16.0)
Lymphocyte #: 0.7 x10 3/mm — ABNORMAL LOW (ref 1.0–3.6)
Lymphocyte %: 10.2 %
Monocyte #: 1 x10 3/mm — ABNORMAL HIGH (ref 0.2–0.9)
Monocyte %: 15.2 %
Neutrophil #: 4.6 x10 3/mm (ref 1.4–6.5)
Platelet: 133 x10 3/mm — ABNORMAL LOW (ref 150–440)

## 2013-02-27 LAB — CREATININE, SERUM
Creatinine: 1.17 mg/dL (ref 0.60–1.30)
EGFR (African American): 53 — ABNORMAL LOW
EGFR (Non-African Amer.): 46 — ABNORMAL LOW

## 2013-02-27 LAB — POTASSIUM: Potassium: 3.4 mmol/L — ABNORMAL LOW (ref 3.5–5.1)

## 2013-02-27 LAB — MAGNESIUM: Magnesium: 1.6 mg/dL — ABNORMAL LOW

## 2013-03-01 LAB — MAGNESIUM: Magnesium: 1.6 mg/dL — ABNORMAL LOW

## 2013-03-01 LAB — POTASSIUM: Potassium: 3.3 mmol/L — ABNORMAL LOW (ref 3.5–5.1)

## 2013-03-04 LAB — CBC CANCER CENTER
Basophil %: 0.5 %
Eosinophil #: 0 x10 3/mm (ref 0.0–0.7)
HCT: 29.2 % — ABNORMAL LOW (ref 35.0–47.0)
Lymphocyte %: 10.6 %
MCH: 34.4 pg — ABNORMAL HIGH (ref 26.0–34.0)
MCHC: 35.9 g/dL (ref 32.0–36.0)
Monocyte #: 1.2 x10 3/mm — ABNORMAL HIGH (ref 0.2–0.9)
Neutrophil #: 6.3 x10 3/mm (ref 1.4–6.5)
Neutrophil %: 74.4 %
RBC: 3.04 10*6/uL — ABNORMAL LOW (ref 3.80–5.20)
RDW: 16.3 % — ABNORMAL HIGH (ref 11.5–14.5)

## 2013-03-04 LAB — MAGNESIUM: Magnesium: 1.5 mg/dL — ABNORMAL LOW

## 2013-03-11 LAB — CBC CANCER CENTER
Basophil #: 0.2 x10 3/mm — ABNORMAL HIGH (ref 0.0–0.1)
Eosinophil %: 1.9 %
HGB: 11.2 g/dL — ABNORMAL LOW (ref 12.0–16.0)
Lymphocyte %: 12 %
MCH: 35.6 pg — ABNORMAL HIGH (ref 26.0–34.0)
MCV: 99 fL (ref 80–100)
Monocyte #: 1 x10 3/mm — ABNORMAL HIGH (ref 0.2–0.9)
Monocyte %: 13 %
Neutrophil %: 70.9 %
Platelet: 486 x10 3/mm — ABNORMAL HIGH (ref 150–440)
RBC: 3.14 10*6/uL — ABNORMAL LOW (ref 3.80–5.20)
RDW: 18 % — ABNORMAL HIGH (ref 11.5–14.5)

## 2013-03-11 LAB — MAGNESIUM: Magnesium: 1.8 mg/dL

## 2013-03-11 LAB — COMPREHENSIVE METABOLIC PANEL
Anion Gap: 6 — ABNORMAL LOW (ref 7–16)
BUN: 12 mg/dL (ref 7–18)
Bilirubin,Total: 0.4 mg/dL (ref 0.2–1.0)
Calcium, Total: 8.9 mg/dL (ref 8.5–10.1)
Chloride: 100 mmol/L (ref 98–107)
Co2: 29 mmol/L (ref 21–32)
EGFR (African American): 48 — ABNORMAL LOW
Osmolality: 269 (ref 275–301)
Potassium: 3.9 mmol/L (ref 3.5–5.1)
Total Protein: 6.2 g/dL — ABNORMAL LOW (ref 6.4–8.2)

## 2013-03-19 ENCOUNTER — Ambulatory Visit: Payer: Self-pay | Admitting: Internal Medicine

## 2013-03-20 LAB — MAGNESIUM: Magnesium: 1.8 mg/dL

## 2013-03-20 LAB — CBC CANCER CENTER
Basophil %: 0.2 %
Eosinophil %: 0.2 %
Lymphocyte #: 0.3 x10 3/mm — ABNORMAL LOW (ref 1.0–3.6)
Lymphocyte %: 9.1 %
MCH: 36.3 pg — ABNORMAL HIGH (ref 26.0–34.0)
MCHC: 35.8 g/dL (ref 32.0–36.0)
MCV: 102 fL — ABNORMAL HIGH (ref 80–100)
Monocyte #: 0 x10 3/mm — ABNORMAL LOW (ref 0.2–0.9)
Monocyte %: 0.3 %
Neutrophil #: 3.1 x10 3/mm (ref 1.4–6.5)
Neutrophil %: 90.2 %
Platelet: 258 x10 3/mm (ref 150–440)
WBC: 3.4 x10 3/mm — ABNORMAL LOW (ref 3.6–11.0)

## 2013-03-20 LAB — POTASSIUM: Potassium: 3.9 mmol/L (ref 3.5–5.1)

## 2013-03-20 LAB — CREATININE, SERUM
EGFR (African American): 57 — ABNORMAL LOW
EGFR (Non-African Amer.): 49 — ABNORMAL LOW

## 2013-03-27 LAB — BASIC METABOLIC PANEL WITH GFR
Anion Gap: 10
BUN: 10 mg/dL
Calcium, Total: 8.7 mg/dL
Chloride: 100 mmol/L
Co2: 25 mmol/L
Creatinine: 1.06 mg/dL
EGFR (African American): 60 — ABNORMAL LOW
EGFR (Non-African Amer.): 52 — ABNORMAL LOW
Glucose: 157 mg/dL — ABNORMAL HIGH
Osmolality: 272
Potassium: 3.8 mmol/L
Sodium: 135 mmol/L — ABNORMAL LOW

## 2013-03-27 LAB — CBC CANCER CENTER
Basophil #: 0 "x10 3/mm "
Basophil %: 0.4 %
Eosinophil #: 0 "x10 3/mm "
Eosinophil %: 0 %
HCT: 25.8 % — ABNORMAL LOW
HGB: 9.4 g/dL — ABNORMAL LOW
Lymphocyte %: 11.3 %
Lymphs Abs: 0.3 "x10 3/mm " — ABNORMAL LOW
MCH: 37.2 pg — ABNORMAL HIGH
MCHC: 36.5 g/dL — ABNORMAL HIGH
MCV: 102 fL — ABNORMAL HIGH
Monocyte #: 0 "x10 3/mm " — ABNORMAL LOW
Monocyte %: 0.6 %
Neutrophil #: 2 "x10 3/mm "
Neutrophil %: 87.7 %
Platelet: 299 "x10 3/mm "
RBC: 2.53 "x10 6/mm " — ABNORMAL LOW
RDW: 19.3 % — ABNORMAL HIGH
WBC: 2.2 "x10 3/mm " — ABNORMAL LOW

## 2013-04-03 LAB — CBC CANCER CENTER
Basophil #: 0 x10 3/mm (ref 0.0–0.1)
Basophil %: 0.2 %
HCT: 24.7 % — ABNORMAL LOW (ref 35.0–47.0)
Lymphocyte #: 0.3 x10 3/mm — ABNORMAL LOW (ref 1.0–3.6)
MCHC: 36.1 g/dL — ABNORMAL HIGH (ref 32.0–36.0)
Monocyte #: 0 x10 3/mm — ABNORMAL LOW (ref 0.2–0.9)
Monocyte %: 0.5 %
Neutrophil #: 1.5 x10 3/mm (ref 1.4–6.5)
RBC: 2.42 10*6/uL — ABNORMAL LOW (ref 3.80–5.20)
RDW: 19.9 % — ABNORMAL HIGH (ref 11.5–14.5)
WBC: 1.8 x10 3/mm — CL (ref 3.6–11.0)

## 2013-04-03 LAB — BASIC METABOLIC PANEL
Anion Gap: 5 — ABNORMAL LOW (ref 7–16)
Co2: 25 mmol/L (ref 21–32)
Osmolality: 268 (ref 275–301)
Potassium: 3.6 mmol/L (ref 3.5–5.1)

## 2013-04-10 LAB — HEPATIC FUNCTION PANEL A (ARMC)
Bilirubin, Direct: 0.1 mg/dL (ref 0.00–0.20)
SGPT (ALT): 21 U/L (ref 12–78)
Total Protein: 5.6 g/dL — ABNORMAL LOW (ref 6.4–8.2)

## 2013-04-10 LAB — CBC CANCER CENTER
Basophil #: 0 x10 3/mm (ref 0.0–0.1)
Basophil %: 0.5 %
Eosinophil %: 0.3 %
HCT: 24.3 % — ABNORMAL LOW (ref 35.0–47.0)
MCV: 105 fL — ABNORMAL HIGH (ref 80–100)
Monocyte %: 0.8 %
RBC: 2.33 10*6/uL — ABNORMAL LOW (ref 3.80–5.20)
RDW: 19.9 % — ABNORMAL HIGH (ref 11.5–14.5)

## 2013-04-10 LAB — CREATININE, SERUM: EGFR (Non-African Amer.): 47 — ABNORMAL LOW

## 2013-04-17 LAB — CBC CANCER CENTER
Basophil %: 0.2 %
Eosinophil #: 0 x10 3/mm (ref 0.0–0.7)
Eosinophil %: 0 %
HCT: 23.8 % — ABNORMAL LOW (ref 35.0–47.0)
HGB: 8.7 g/dL — ABNORMAL LOW (ref 12.0–16.0)
MCH: 38.9 pg — ABNORMAL HIGH (ref 26.0–34.0)
Monocyte #: 0 x10 3/mm — ABNORMAL LOW (ref 0.2–0.9)
Monocyte %: 0.9 %
Neutrophil #: 2.8 x10 3/mm (ref 1.4–6.5)
Neutrophil %: 88.2 %
RBC: 2.25 10*6/uL — ABNORMAL LOW (ref 3.80–5.20)

## 2013-04-19 ENCOUNTER — Ambulatory Visit: Payer: Self-pay | Admitting: Internal Medicine

## 2013-04-24 LAB — CBC CANCER CENTER
Basophil #: 0 x10 3/mm (ref 0.0–0.1)
Basophil %: 0.5 %
Eosinophil #: 0 x10 3/mm (ref 0.0–0.7)
Eosinophil %: 0.1 %
HCT: 23.1 % — ABNORMAL LOW (ref 35.0–47.0)
Lymphocyte #: 0.3 x10 3/mm — ABNORMAL LOW (ref 1.0–3.6)
Lymphocyte %: 10.7 %
MCHC: 37 g/dL — ABNORMAL HIGH (ref 32.0–36.0)
Monocyte #: 0 x10 3/mm — ABNORMAL LOW (ref 0.2–0.9)
Monocyte %: 1 %
Neutrophil #: 2.7 x10 3/mm (ref 1.4–6.5)
Neutrophil %: 87.7 %
Platelet: 333 x10 3/mm (ref 150–440)
RBC: 2.17 10*6/uL — ABNORMAL LOW (ref 3.80–5.20)

## 2013-04-24 LAB — CREATININE, SERUM
Creatinine: 1.07 mg/dL (ref 0.60–1.30)
EGFR (African American): 59 — ABNORMAL LOW

## 2013-05-01 LAB — CBC CANCER CENTER
Eosinophil #: 0 x10 3/mm (ref 0.0–0.7)
Lymphocyte #: 0.5 x10 3/mm — ABNORMAL LOW (ref 1.0–3.6)
Lymphocyte %: 14.5 %
MCH: 40 pg — ABNORMAL HIGH (ref 26.0–34.0)
MCHC: 36.4 g/dL — ABNORMAL HIGH (ref 32.0–36.0)
MCV: 110 fL — ABNORMAL HIGH (ref 80–100)
Monocyte #: 0 x10 3/mm — ABNORMAL LOW (ref 0.2–0.9)
Monocyte %: 1.1 %
Neutrophil #: 3 x10 3/mm (ref 1.4–6.5)
Neutrophil %: 83.9 %
RBC: 2.49 10*6/uL — ABNORMAL LOW (ref 3.80–5.20)
WBC: 3.6 x10 3/mm (ref 3.6–11.0)

## 2013-05-01 LAB — COMPREHENSIVE METABOLIC PANEL
Anion Gap: 11 (ref 7–16)
BUN: 10 mg/dL (ref 7–18)
Calcium, Total: 8.6 mg/dL (ref 8.5–10.1)
Chloride: 98 mmol/L (ref 98–107)
Co2: 23 mmol/L (ref 21–32)
Creatinine: 1.02 mg/dL (ref 0.60–1.30)
EGFR (African American): >60
EGFR (Non-African Amer.): 54 — ABNORMAL LOW
SGPT (ALT): 21 U/L (ref 12–78)
Sodium: 132 mmol/L — ABNORMAL LOW (ref 136–145)
Total Protein: 5.7 g/dL — ABNORMAL LOW (ref 6.4–8.2)

## 2013-05-08 LAB — CBC CANCER CENTER
Basophil %: 0.3 %
HCT: 26.8 % — ABNORMAL LOW (ref 35.0–47.0)
Lymphocyte #: 0.7 x10 3/mm — ABNORMAL LOW (ref 1.0–3.6)
Lymphocyte %: 10.8 %
MCH: 40.6 pg — ABNORMAL HIGH (ref 26.0–34.0)
Monocyte #: 0.1 x10 3/mm — ABNORMAL LOW (ref 0.2–0.9)
Monocyte %: 0.9 %
Neutrophil #: 5.9 x10 3/mm (ref 1.4–6.5)
Neutrophil %: 88 %
RDW: 19.8 % — ABNORMAL HIGH (ref 11.5–14.5)

## 2013-05-15 LAB — CBC CANCER CENTER
Basophil #: 0 x10 3/mm (ref 0.0–0.1)
Eosinophil #: 0 x10 3/mm (ref 0.0–0.7)
HCT: 24.2 % — ABNORMAL LOW (ref 35.0–47.0)
HGB: 9 g/dL — ABNORMAL LOW (ref 12.0–16.0)
Lymphocyte #: 0.5 x10 3/mm — ABNORMAL LOW (ref 1.0–3.6)
Lymphocyte %: 22.2 %
MCHC: 37 g/dL — ABNORMAL HIGH (ref 32.0–36.0)
MCV: 111 fL — ABNORMAL HIGH (ref 80–100)
Monocyte %: 2 %
Neutrophil #: 1.7 x10 3/mm (ref 1.4–6.5)
RDW: 19.2 % — ABNORMAL HIGH (ref 11.5–14.5)
WBC: 2.2 x10 3/mm — ABNORMAL LOW (ref 3.6–11.0)

## 2013-05-20 ENCOUNTER — Ambulatory Visit: Payer: Self-pay | Admitting: Internal Medicine

## 2013-05-22 LAB — COMPREHENSIVE METABOLIC PANEL
Albumin: 2.9 g/dL — ABNORMAL LOW (ref 3.4–5.0)
Calcium, Total: 8.2 mg/dL — ABNORMAL LOW (ref 8.5–10.1)
Chloride: 99 mmol/L (ref 98–107)
Creatinine: 1.16 mg/dL (ref 0.60–1.30)
EGFR (Non-African Amer.): 46 — ABNORMAL LOW
Osmolality: 267 (ref 275–301)
Potassium: 3.6 mmol/L (ref 3.5–5.1)
SGOT(AST): 17 U/L (ref 15–37)
Total Protein: 5.3 g/dL — ABNORMAL LOW (ref 6.4–8.2)

## 2013-05-22 LAB — CBC CANCER CENTER
Basophil #: 0 x10 3/mm (ref 0.0–0.1)
Basophil %: 0.3 %
HGB: 8.6 g/dL — ABNORMAL LOW (ref 12.0–16.0)
Lymphocyte #: 0.7 x10 3/mm — ABNORMAL LOW (ref 1.0–3.6)
Lymphocyte %: 21.8 %
MCHC: 34.4 g/dL (ref 32.0–36.0)
MCV: 113 fL — ABNORMAL HIGH (ref 80–100)
Monocyte %: 2.1 %
Neutrophil %: 75.8 %
Platelet: 337 x10 3/mm (ref 150–440)
RBC: 2.21 10*6/uL — ABNORMAL LOW (ref 3.80–5.20)
WBC: 3.1 x10 3/mm — ABNORMAL LOW (ref 3.6–11.0)

## 2013-05-29 LAB — CBC CANCER CENTER
Basophil %: 0.2 %
HGB: 8.3 g/dL — ABNORMAL LOW (ref 12.0–16.0)
MCH: 41 pg — ABNORMAL HIGH (ref 26.0–34.0)
MCHC: 36.2 g/dL — ABNORMAL HIGH (ref 32.0–36.0)
Monocyte #: 0 x10 3/mm — ABNORMAL LOW (ref 0.2–0.9)
Monocyte %: 0.6 %
Neutrophil #: 3.1 x10 3/mm (ref 1.4–6.5)
Neutrophil %: 85.1 %
Platelet: 324 x10 3/mm (ref 150–440)
RBC: 2.01 10*6/uL — ABNORMAL LOW (ref 3.80–5.20)
RDW: 18.8 % — ABNORMAL HIGH (ref 11.5–14.5)
WBC: 3.7 x10 3/mm (ref 3.6–11.0)

## 2013-05-29 LAB — CREATININE, SERUM
Creatinine: 1.15 mg/dL (ref 0.60–1.30)
EGFR (Non-African Amer.): 47 — ABNORMAL LOW

## 2013-06-06 LAB — CBC CANCER CENTER
Basophil #: 0 x10 3/mm (ref 0.0–0.1)
Basophil %: 0.2 %
Eosinophil #: 0 x10 3/mm (ref 0.0–0.7)
HCT: 25 % — ABNORMAL LOW (ref 35.0–47.0)
Lymphocyte #: 0.6 x10 3/mm — ABNORMAL LOW (ref 1.0–3.6)
MCH: 40.9 pg — ABNORMAL HIGH (ref 26.0–34.0)
MCHC: 35 g/dL (ref 32.0–36.0)
MCV: 117 fL — ABNORMAL HIGH (ref 80–100)
Monocyte #: 0 x10 3/mm — ABNORMAL LOW (ref 0.2–0.9)
Neutrophil #: 3.4 x10 3/mm (ref 1.4–6.5)
Neutrophil %: 85 %
Platelet: 327 x10 3/mm (ref 150–440)
RBC: 2.14 10*6/uL — ABNORMAL LOW (ref 3.80–5.20)
RDW: 18.6 % — ABNORMAL HIGH (ref 11.5–14.5)
WBC: 4 x10 3/mm (ref 3.6–11.0)

## 2013-06-19 ENCOUNTER — Ambulatory Visit: Payer: Self-pay | Admitting: Internal Medicine

## 2013-06-20 LAB — CBC CANCER CENTER
Eosinophil #: 0 x10 3/mm (ref 0.0–0.7)
Eosinophil %: 0.1 %
HCT: 29.9 % — ABNORMAL LOW (ref 35.0–47.0)
HGB: 10.6 g/dL — ABNORMAL LOW (ref 12.0–16.0)
Lymphocyte #: 0.9 x10 3/mm — ABNORMAL LOW (ref 1.0–3.6)
MCH: 42.7 pg — ABNORMAL HIGH (ref 26.0–34.0)
MCHC: 35.4 g/dL (ref 32.0–36.0)
MCV: 121 fL — ABNORMAL HIGH (ref 80–100)
Monocyte %: 1 %
Neutrophil #: 3.1 x10 3/mm (ref 1.4–6.5)
Neutrophil %: 76.8 %
Platelet: 310 x10 3/mm (ref 150–440)
WBC: 4.1 x10 3/mm (ref 3.6–11.0)

## 2013-06-20 LAB — COMPREHENSIVE METABOLIC PANEL
Albumin: 3 g/dL — ABNORMAL LOW (ref 3.4–5.0)
Alkaline Phosphatase: 146 U/L — ABNORMAL HIGH (ref 50–136)
BUN: 9 mg/dL (ref 7–18)
Bilirubin,Total: 0.7 mg/dL (ref 0.2–1.0)
Co2: 28 mmol/L (ref 21–32)
Creatinine: 1.09 mg/dL (ref 0.60–1.30)
EGFR (African American): 58 — ABNORMAL LOW
EGFR (Non-African Amer.): 50 — ABNORMAL LOW
Glucose: 161 mg/dL — ABNORMAL HIGH (ref 65–99)
Osmolality: 272 (ref 275–301)
Potassium: 3.9 mmol/L (ref 3.5–5.1)
SGOT(AST): 17 U/L (ref 15–37)

## 2013-06-26 ENCOUNTER — Ambulatory Visit: Payer: Self-pay | Admitting: Internal Medicine

## 2013-07-02 LAB — CBC CANCER CENTER
Basophil #: 0 x10 3/mm (ref 0.0–0.1)
Basophil %: 0.9 %
HCT: 32.8 % — ABNORMAL LOW (ref 35.0–47.0)
HGB: 11.6 g/dL — ABNORMAL LOW (ref 12.0–16.0)
Lymphocyte #: 1.5 x10 3/mm (ref 1.0–3.6)
MCH: 41.2 pg — ABNORMAL HIGH (ref 26.0–34.0)
MCHC: 35.3 g/dL (ref 32.0–36.0)
MCV: 117 fL — ABNORMAL HIGH (ref 80–100)
Monocyte %: 5.4 %
Platelet: 194 x10 3/mm (ref 150–440)
WBC: 4.4 x10 3/mm (ref 3.6–11.0)

## 2013-07-10 ENCOUNTER — Encounter: Payer: Self-pay | Admitting: General Surgery

## 2013-07-10 ENCOUNTER — Ambulatory Visit (INDEPENDENT_AMBULATORY_CARE_PROVIDER_SITE_OTHER): Payer: Medicare Other | Admitting: General Surgery

## 2013-07-10 VITALS — BP 104/60 | HR 66 | Resp 12 | Ht 64.0 in | Wt 108.0 lb

## 2013-07-10 DIAGNOSIS — R221 Localized swelling, mass and lump, neck: Secondary | ICD-10-CM

## 2013-07-10 DIAGNOSIS — R220 Localized swelling, mass and lump, head: Secondary | ICD-10-CM

## 2013-07-10 DIAGNOSIS — C50912 Malignant neoplasm of unspecified site of left female breast: Secondary | ICD-10-CM

## 2013-07-10 DIAGNOSIS — C50919 Malignant neoplasm of unspecified site of unspecified female breast: Secondary | ICD-10-CM

## 2013-07-10 NOTE — Progress Notes (Signed)
Patient ID: Christine Strickland, female   DOB: 03-17-1936, 77 y.o.   MRN: [id number]  Chief Complaint  Patient presents with  . Follow-up    treatment    HPI Christine Strickland is a 77 y.o. female here today following up for cancer treatment . Patient states she is doing well.  The patient was seen in spring 2014 when she was diagnosed with advanced left breast cancer. She presented with axillary and supraclavicular adenopathy. She has undergone neoadjuvant chemotherapy. Her most recent PET scan did not show evidence of residual active disease. She is seen today to decide whether primary resection would be beneficial for this triple negative tumor.  The patient is still aware of a palpable mass in the base of the left neck consistent with supraclavicular lymph nodes, but these are nontender and again her PET scan did not show increased activity. HPI  Past Medical History  Diagnosis Date  . Clotting disorder 1999    platlets  . Scoliosis   . Breast cancer 2001    Right  . Malignant neoplasm of breast (female), unspecified site 2014    Stage 3 w/ supraclavicular metastasis.    Past Surgical History  Procedure Laterality Date  . Back surgery  2005  . Back surgery    . Breast biopsy Right 2014    No family history on file.  Social History History  Substance Use Topics  . Smoking status: Current Every Day Smoker -- 0.05 packs/day for 10 years  . Smokeless tobacco: Never Used  . Alcohol Use: 8.4 oz/week    14 Glasses of wine per week    No Known Allergies  Current Outpatient Prescriptions  Medication Sig Dispense Refill  . amLODipine (NORVASC) 5 MG tablet Take 1 tablet by mouth daily.      Marland Kitchen anagrelide (AGRYLIN) 0.5 MG capsule Take 0.5 mg by mouth daily.      . Ascorbic Acid (VITAMIN C PO) Take 1 tablet by mouth daily.      . Biotin 1000 MCG tablet Take 1,000 mcg by mouth daily.      . CELEBREX 200 MG capsule Take 1 capsule by mouth daily.      Marland Kitchen lactulose (CHRONULAC) 10 GM/15ML solution        . metoprolol succinate (TOPROL-XL) 50 MG 24 hr tablet Take 1 tablet by mouth daily.      . traMADol (ULTRAM) 50 MG tablet Take 1 tablet by mouth daily.      . vitamin E (VITAMIN E) 400 UNIT capsule Take 400 Units by mouth daily.       No current facility-administered medications for this visit.    Review of Systems Review of Systems  Constitutional: Negative.   Respiratory: Negative.   Cardiovascular: Negative.     Blood pressure 104/60, pulse 66, resp. rate 12, height 5\' 4"  (1.626 m), weight 108 lb (48.988 kg).  Physical Exam Physical Exam  Constitutional: She is oriented to person, place, and time. She appears well-developed and well-nourished.  Neck: No thyromegaly present.  Cardiovascular: Normal rate, regular rhythm and normal heart sounds.   No murmur heard. Pulmonary/Chest: Effort normal. She has rhonchi. Right breast exhibits no inverted nipple, no mass, no nipple discharge, no skin change and no tenderness. Left breast exhibits no inverted nipple, no mass, no nipple discharge, no skin change and no tenderness.  Lymphadenopathy:    She has cervical adenopathy.       Left cervical: Superficial cervical adenopathy present.    She has  no axillary adenopathy.  Neurological: She is alert and oriented to person, place, and time.  Skin: Skin is warm and dry.    Data Reviewed PET scan reviewed.  Ultrasound examination of the left axilla shows minimally enlarged lymph nodes measuring up to 1.5 cm in diameter. There is some modest loss of normal echo architecture.  In the left supraclavicular area 8 dominant mass measuring 0.8 x 1.0 x 1.3 cm with extensive vascularity is identified. This corresponds to the palpable mass on clinical exam.  Assessment    Stage III left breast cancer, triple negative.    Plan    There is no data to suggest a primary resection for advanced to the labor scant or improved prognosis. The case was discussed with the patient's medical oncologist,  Benita Gutter, M.D. In light of the negative pad and a highly vascular lesion on ultrasound, this may represent indeed complete obliteration of the tumor mass with preservation of the vasculature.  The patient will continue careful follow up with Dr. Lorre Nick. Followup ear were begun as needed basis.       Earline Mayotte 07/12/2013, 4:21 PM

## 2013-07-11 ENCOUNTER — Encounter: Payer: Self-pay | Admitting: General Surgery

## 2013-07-12 ENCOUNTER — Encounter: Payer: Self-pay | Admitting: General Surgery

## 2013-07-12 DIAGNOSIS — C50919 Malignant neoplasm of unspecified site of unspecified female breast: Secondary | ICD-10-CM | POA: Insufficient documentation

## 2013-07-20 ENCOUNTER — Ambulatory Visit: Payer: Self-pay | Admitting: Internal Medicine

## 2013-07-24 ENCOUNTER — Ambulatory Visit: Payer: Self-pay | Admitting: Internal Medicine

## 2013-07-30 ENCOUNTER — Inpatient Hospital Stay: Payer: Self-pay | Admitting: Internal Medicine

## 2013-07-30 LAB — COMPREHENSIVE METABOLIC PANEL
Albumin: 2.7 g/dL — ABNORMAL LOW (ref 3.4–5.0)
Alkaline Phosphatase: 162 U/L — ABNORMAL HIGH (ref 50–136)
Anion Gap: 6 — ABNORMAL LOW (ref 7–16)
BUN: 12 mg/dL (ref 7–18)
Bilirubin,Total: 1 mg/dL (ref 0.2–1.0)
Calcium, Total: 8.1 mg/dL — ABNORMAL LOW (ref 8.5–10.1)
Glucose: 116 mg/dL — ABNORMAL HIGH (ref 65–99)
Osmolality: 269 (ref 275–301)
SGOT(AST): 26 U/L (ref 15–37)
SGPT (ALT): 23 U/L (ref 12–78)
Sodium: 134 mmol/L — ABNORMAL LOW (ref 136–145)

## 2013-07-30 LAB — URINALYSIS, COMPLETE
Bilirubin,UR: NEGATIVE
Blood: NEGATIVE
Ketone: NEGATIVE
Protein: NEGATIVE
RBC,UR: 1 /HPF (ref 0–5)
Specific Gravity: 1.006 (ref 1.003–1.030)
Squamous Epithelial: <1

## 2013-07-30 LAB — CK TOTAL AND CKMB (NOT AT ARMC): CK-MB: 2.3 ng/mL (ref 0.5–3.6)

## 2013-07-30 LAB — CBC
HCT: 32.2 % — ABNORMAL LOW (ref 35.0–47.0)
MCHC: 35.2 g/dL (ref 32.0–36.0)
MCV: 114 fL — ABNORMAL HIGH (ref 80–100)
RBC: 2.83 10*6/uL — ABNORMAL LOW (ref 3.80–5.20)
RDW: 14.9 % — ABNORMAL HIGH (ref 11.5–14.5)

## 2013-07-30 LAB — TROPONIN I: Troponin-I: <0.02 ng/mL

## 2013-07-30 LAB — PROTIME-INR
INR: 1
Prothrombin Time: 13.3 secs (ref 11.5–14.7)

## 2013-07-30 LAB — APTT: Activated PTT: 30.6 secs (ref 23.6–35.9)

## 2013-07-31 LAB — BASIC METABOLIC PANEL
Anion Gap: 9 (ref 7–16)
Chloride: 102 mmol/L (ref 98–107)
Co2: 22 mmol/L (ref 21–32)
Creatinine: 1.02 mg/dL (ref 0.60–1.30)
EGFR (African American): >60
Glucose: 94 mg/dL (ref 65–99)
Sodium: 133 mmol/L — ABNORMAL LOW (ref 136–145)

## 2013-07-31 LAB — CBC WITH DIFFERENTIAL/PLATELET
Basophil #: 0.1 10*3/uL (ref 0.0–0.1)
Basophil %: 0.5 %
Eosinophil %: 0.7 %
HCT: 28.6 % — ABNORMAL LOW (ref 35.0–47.0)
Lymphocyte #: 2.1 10*3/uL (ref 1.0–3.6)
MCHC: 35.2 g/dL (ref 32.0–36.0)
MCV: 115 fL — ABNORMAL HIGH (ref 80–100)
Monocyte %: 8.5 %
Neutrophil #: 10.4 10*3/uL — ABNORMAL HIGH (ref 1.4–6.5)
Neutrophil %: 75.2 %
Platelet: 240 10*3/uL (ref 150–440)
RBC: 2.48 10*6/uL — ABNORMAL LOW (ref 3.80–5.20)
RDW: 15.3 % — ABNORMAL HIGH (ref 11.5–14.5)

## 2013-07-31 LAB — HEMOGLOBIN A1C: Hemoglobin A1C: <3.5 % — ABNORMAL LOW (ref 4.2–6.3)

## 2013-07-31 LAB — MAGNESIUM: Magnesium: 1.2 mg/dL — ABNORMAL LOW

## 2013-08-01 LAB — CBC WITH DIFFERENTIAL/PLATELET
Basophil #: 0.1 10*3/uL (ref 0.0–0.1)
Eosinophil %: 1.5 %
HCT: 27.2 % — ABNORMAL LOW (ref 35.0–47.0)
HGB: 9.5 g/dL — ABNORMAL LOW (ref 12.0–16.0)
Lymphocyte %: 14.3 %
MCHC: 35 g/dL (ref 32.0–36.0)
MCV: 116 fL — ABNORMAL HIGH (ref 80–100)
Monocyte #: 0.9 x10 3/mm (ref 0.2–0.9)
Monocyte %: 8.3 %
Neutrophil #: 8.3 10*3/uL — ABNORMAL HIGH (ref 1.4–6.5)
Neutrophil %: 75.3 %
Platelet: 210 10*3/uL (ref 150–440)
RDW: 15.2 % — ABNORMAL HIGH (ref 11.5–14.5)
WBC: 11 10*3/uL (ref 3.6–11.0)

## 2013-08-01 LAB — BASIC METABOLIC PANEL
Anion Gap: 6 — ABNORMAL LOW (ref 7–16)
BUN: 16 mg/dL (ref 7–18)
Creatinine: 0.99 mg/dL (ref 0.60–1.30)
EGFR (African American): >60
EGFR (Non-African Amer.): 56 — ABNORMAL LOW
Glucose: 138 mg/dL — ABNORMAL HIGH (ref 65–99)
Osmolality: 262 (ref 275–301)
Potassium: 3.5 mmol/L (ref 3.5–5.1)
Sodium: 129 mmol/L — ABNORMAL LOW (ref 136–145)

## 2013-08-02 LAB — BASIC METABOLIC PANEL
BUN: 15 mg/dL (ref 7–18)
Co2: 23 mmol/L (ref 21–32)
Creatinine: 1.09 mg/dL (ref 0.60–1.30)
EGFR (African American): 58 — ABNORMAL LOW
EGFR (Non-African Amer.): 50 — ABNORMAL LOW
Glucose: 116 mg/dL — ABNORMAL HIGH (ref 65–99)
Osmolality: 257 (ref 275–301)
Sodium: 127 mmol/L — ABNORMAL LOW (ref 136–145)

## 2013-08-02 LAB — CBC WITH DIFFERENTIAL/PLATELET
Eosinophil %: 1.7 %
HCT: 24.8 % — ABNORMAL LOW (ref 35.0–47.0)
HGB: 8.5 g/dL — ABNORMAL LOW (ref 12.0–16.0)
Lymphocyte #: 1.5 10*3/uL (ref 1.0–3.6)
Lymphocyte %: 11.5 %
MCH: 39.8 pg — ABNORMAL HIGH (ref 26.0–34.0)
MCHC: 34.3 g/dL (ref 32.0–36.0)
MCV: 116 fL — ABNORMAL HIGH (ref 80–100)
Monocyte #: 1.1 x10 3/mm — ABNORMAL HIGH (ref 0.2–0.9)
Monocyte %: 8.6 %
Neutrophil #: 9.9 10*3/uL — ABNORMAL HIGH (ref 1.4–6.5)
RBC: 2.14 10*6/uL — ABNORMAL LOW (ref 3.80–5.20)
RDW: 15.1 % — ABNORMAL HIGH (ref 11.5–14.5)
WBC: 12.8 10*3/uL — ABNORMAL HIGH (ref 3.6–11.0)

## 2013-08-02 LAB — PATHOLOGY REPORT

## 2013-08-03 LAB — CBC WITH DIFFERENTIAL/PLATELET
Basophil #: 0.1 10*3/uL (ref 0.0–0.1)
HCT: 23 % — ABNORMAL LOW (ref 35.0–47.0)
MCH: 40.6 pg — ABNORMAL HIGH (ref 26.0–34.0)
MCV: 115 fL — ABNORMAL HIGH (ref 80–100)
Monocyte #: 1.1 x10 3/mm — ABNORMAL HIGH (ref 0.2–0.9)
Platelet: 217 10*3/uL (ref 150–440)
RDW: 15.4 % — ABNORMAL HIGH (ref 11.5–14.5)

## 2013-08-03 LAB — BASIC METABOLIC PANEL
Anion Gap: 6 — ABNORMAL LOW (ref 7–16)
BUN: 17 mg/dL (ref 7–18)
Calcium, Total: 7.8 mg/dL — ABNORMAL LOW (ref 8.5–10.1)
Chloride: 98 mmol/L (ref 98–107)
Creatinine: 1.13 mg/dL (ref 0.60–1.30)
EGFR (African American): 55 — ABNORMAL LOW
Osmolality: 256 (ref 275–301)
Sodium: 126 mmol/L — ABNORMAL LOW (ref 136–145)

## 2013-08-04 LAB — CBC WITH DIFFERENTIAL/PLATELET
Basophil %: 0.6 %
HCT: 20.7 % — ABNORMAL LOW (ref 35.0–47.0)
MCH: 40.7 pg — ABNORMAL HIGH (ref 26.0–34.0)
MCHC: 35.6 g/dL (ref 32.0–36.0)
Monocyte %: 11.3 %
Neutrophil #: 6.3 10*3/uL (ref 1.4–6.5)
Neutrophil %: 72.6 %
Platelet: 211 10*3/uL (ref 150–440)
RBC: 1.81 10*6/uL — ABNORMAL LOW (ref 3.80–5.20)
RDW: 14.7 % — ABNORMAL HIGH (ref 11.5–14.5)

## 2013-08-04 LAB — BASIC METABOLIC PANEL
Anion Gap: 5 — ABNORMAL LOW (ref 7–16)
BUN: 17 mg/dL (ref 7–18)
Calcium, Total: 7.7 mg/dL — ABNORMAL LOW (ref 8.5–10.1)
Co2: 22 mmol/L (ref 21–32)
Creatinine: 0.94 mg/dL (ref 0.60–1.30)
EGFR (African American): >60
Osmolality: 256 (ref 275–301)
Potassium: 4 mmol/L (ref 3.5–5.1)
Sodium: 127 mmol/L — ABNORMAL LOW (ref 136–145)

## 2013-08-05 LAB — BASIC METABOLIC PANEL
BUN: 16 mg/dL (ref 7–18)
Calcium, Total: 7.8 mg/dL — ABNORMAL LOW (ref 8.5–10.1)
Chloride: 103 mmol/L (ref 98–107)
Creatinine: 0.88 mg/dL (ref 0.60–1.30)
EGFR (Non-African Amer.): >60
Glucose: 79 mg/dL (ref 65–99)

## 2013-08-16 ENCOUNTER — Encounter: Payer: Self-pay | Admitting: Internal Medicine

## 2013-08-16 LAB — URINALYSIS, COMPLETE
Bilirubin,UR: NEGATIVE
Ketone: NEGATIVE
Ph: 7 (ref 4.5–8.0)
Specific Gravity: 1.008 (ref 1.003–1.030)

## 2013-08-18 LAB — URINE CULTURE

## 2013-08-19 ENCOUNTER — Encounter: Payer: Self-pay | Admitting: Internal Medicine

## 2013-08-19 ENCOUNTER — Ambulatory Visit: Payer: Self-pay | Admitting: Internal Medicine

## 2013-08-22 LAB — COMPREHENSIVE METABOLIC PANEL
Albumin: 2.7 g/dL — ABNORMAL LOW (ref 3.4–5.0)
Alkaline Phosphatase: 136 U/L — ABNORMAL HIGH
Anion Gap: 9 (ref 7–16)
Bilirubin,Total: 0.3 mg/dL (ref 0.2–1.0)
Calcium, Total: 8.5 mg/dL (ref 8.5–10.1)
Chloride: 92 mmol/L — ABNORMAL LOW (ref 98–107)
Creatinine: 1.28 mg/dL (ref 0.60–1.30)
Glucose: 89 mg/dL (ref 65–99)
Osmolality: 246 (ref 275–301)
SGPT (ALT): 14 U/L (ref 12–78)
Sodium: 122 mmol/L — ABNORMAL LOW (ref 136–145)

## 2013-08-22 LAB — CBC WITH DIFFERENTIAL/PLATELET
Basophil #: 0.1 10*3/uL (ref 0.0–0.1)
Basophil %: 0.9 %
Eosinophil %: 0.5 %
HCT: 31.1 % — ABNORMAL LOW (ref 35.0–47.0)
HGB: 10.8 g/dL — ABNORMAL LOW (ref 12.0–16.0)
Lymphocyte %: 9.4 %
MCH: 35.6 pg — ABNORMAL HIGH (ref 26.0–34.0)
MCHC: 34.8 g/dL (ref 32.0–36.0)
MCV: 102 fL — ABNORMAL HIGH (ref 80–100)
Monocyte %: 5.7 %
Neutrophil #: 13.2 10*3/uL — ABNORMAL HIGH (ref 1.4–6.5)
Neutrophil %: 83.5 %
Platelet: 374 10*3/uL (ref 150–440)
RBC: 3.04 10*6/uL — ABNORMAL LOW (ref 3.80–5.20)
WBC: 15.8 10*3/uL — ABNORMAL HIGH (ref 3.6–11.0)

## 2013-08-22 LAB — URINALYSIS, COMPLETE
Blood: NEGATIVE
Ketone: NEGATIVE
Ph: 6 (ref 4.5–8.0)
Protein: NEGATIVE
Specific Gravity: 1.011 (ref 1.003–1.030)
Squamous Epithelial: 5

## 2013-08-24 LAB — BASIC METABOLIC PANEL
Anion Gap: 12 (ref 7–16)
Calcium, Total: 8.4 mg/dL — ABNORMAL LOW (ref 8.5–10.1)
Chloride: 98 mmol/L (ref 98–107)
Creatinine: 0.95 mg/dL (ref 0.60–1.30)
EGFR (African American): >60
Potassium: 3.8 mmol/L (ref 3.5–5.1)

## 2013-08-24 LAB — URINE CULTURE

## 2013-08-26 ENCOUNTER — Ambulatory Visit: Payer: Self-pay | Admitting: Gerontology

## 2013-08-26 ENCOUNTER — Inpatient Hospital Stay: Payer: Self-pay | Admitting: Internal Medicine

## 2013-08-26 LAB — CBC WITH DIFFERENTIAL/PLATELET
Basophil #: 0.1 10*3/uL (ref 0.0–0.1)
Basophil %: 1 %
Eosinophil #: 0.3 10*3/uL (ref 0.0–0.7)
Eosinophil %: 2.1 %
HGB: 8.9 g/dL — ABNORMAL LOW (ref 12.0–16.0)
Lymphocyte #: 1.5 10*3/uL (ref 1.0–3.6)
Lymphocyte %: 10.7 %
MCH: 33.6 pg (ref 26.0–34.0)
MCHC: 32.7 g/dL (ref 32.0–36.0)
Monocyte #: 1 x10 3/mm — ABNORMAL HIGH (ref 0.2–0.9)
Monocyte %: 6.6 %
Neutrophil #: 11.5 10*3/uL — ABNORMAL HIGH (ref 1.4–6.5)
Neutrophil %: 79.6 %
Platelet: 372 10*3/uL (ref 150–440)
RBC: 2.64 10*6/uL — ABNORMAL LOW (ref 3.80–5.20)

## 2013-08-26 LAB — CBC
HCT: 26.9 % — ABNORMAL LOW (ref 35.0–47.0)
HGB: 9 g/dL — ABNORMAL LOW (ref 12.0–16.0)
MCHC: 33.5 g/dL (ref 32.0–36.0)
MCV: 103 fL — ABNORMAL HIGH (ref 80–100)
Platelet: 362 10*3/uL (ref 150–440)
WBC: 12.6 10*3/uL — ABNORMAL HIGH (ref 3.6–11.0)

## 2013-08-26 LAB — MAGNESIUM
Magnesium: 1.4 mg/dL — ABNORMAL LOW
Magnesium: 1.7 mg/dL — ABNORMAL LOW

## 2013-08-26 LAB — URINALYSIS, COMPLETE
Bacteria: NONE SEEN
Bilirubin,UR: NEGATIVE
Glucose,UR: NEGATIVE mg/dL (ref 0–75)
Ketone: NEGATIVE
Leukocyte Esterase: NEGATIVE
Protein: NEGATIVE
RBC,UR: NONE SEEN /HPF (ref 0–5)
Squamous Epithelial: 1
WBC UR: 3 /HPF (ref 0–5)

## 2013-08-26 LAB — TROPONIN I: Troponin-I: <0.02 ng/mL

## 2013-08-26 LAB — COMPREHENSIVE METABOLIC PANEL
Alkaline Phosphatase: 120 U/L — ABNORMAL HIGH
BUN: 8 mg/dL (ref 7–18)
Calcium, Total: 8.4 mg/dL — ABNORMAL LOW (ref 8.5–10.1)
Chloride: 103 mmol/L (ref 98–107)
EGFR (Non-African Amer.): >60
Osmolality: 266 (ref 275–301)
Potassium: 3.4 mmol/L — ABNORMAL LOW (ref 3.5–5.1)
SGOT(AST): 15 U/L (ref 15–37)

## 2013-08-26 LAB — BASIC METABOLIC PANEL
Anion Gap: 9 (ref 7–16)
BUN: 10 mg/dL (ref 7–18)
Calcium, Total: 8.6 mg/dL (ref 8.5–10.1)
EGFR (African American): >60
EGFR (Non-African Amer.): 53 — ABNORMAL LOW
Potassium: 3.9 mmol/L (ref 3.5–5.1)
Sodium: 133 mmol/L — ABNORMAL LOW (ref 136–145)

## 2013-08-26 LAB — AMMONIA: Ammonia, Plasma: 43 mcmol/L — ABNORMAL HIGH (ref 11–32)

## 2013-08-27 LAB — CBC WITH DIFFERENTIAL/PLATELET
Eosinophil %: 2.6 %
HCT: 25.1 % — ABNORMAL LOW (ref 35.0–47.0)
HGB: 8.4 g/dL — ABNORMAL LOW (ref 12.0–16.0)
Lymphocyte #: 1.3 10*3/uL (ref 1.0–3.6)
MCH: 35.3 pg — ABNORMAL HIGH (ref 26.0–34.0)
Monocyte #: 0.7 x10 3/mm (ref 0.2–0.9)
Neutrophil #: 7.9 10*3/uL — ABNORMAL HIGH (ref 1.4–6.5)
Platelet: 332 10*3/uL (ref 150–440)
WBC: 10.3 10*3/uL (ref 3.6–11.0)

## 2013-08-27 LAB — BASIC METABOLIC PANEL
Anion Gap: 12 (ref 7–16)
BUN: 7 mg/dL (ref 7–18)
Chloride: 107 mmol/L (ref 98–107)
Co2: 19 mmol/L — ABNORMAL LOW (ref 21–32)
Creatinine: 0.84 mg/dL (ref 0.60–1.30)
EGFR (African American): >60
Osmolality: 273 (ref 275–301)
Potassium: 3.5 mmol/L (ref 3.5–5.1)

## 2013-08-30 LAB — BASIC METABOLIC PANEL
BUN: 8 mg/dL (ref 7–18)
Calcium, Total: 8.2 mg/dL — ABNORMAL LOW (ref 8.5–10.1)
Creatinine: 0.89 mg/dL (ref 0.60–1.30)
EGFR (African American): >60
EGFR (Non-African Amer.): >60
Glucose: 102 mg/dL — ABNORMAL HIGH (ref 65–99)
Osmolality: 276 (ref 275–301)
Sodium: 139 mmol/L (ref 136–145)

## 2013-08-30 LAB — CBC WITH DIFFERENTIAL/PLATELET
Basophil %: 1.3 %
Eosinophil %: 6.8 %
MCH: 33.7 pg (ref 26.0–34.0)
Monocyte #: 0.8 x10 3/mm (ref 0.2–0.9)
Monocyte %: 10.4 %
Neutrophil %: 63.6 %
Platelet: 310 10*3/uL (ref 150–440)
RBC: 2.48 10*6/uL — ABNORMAL LOW (ref 3.80–5.20)
RDW: 18 % — ABNORMAL HIGH (ref 11.5–14.5)

## 2013-08-30 LAB — MAGNESIUM: Magnesium: 1.5 mg/dL — ABNORMAL LOW

## 2013-08-31 LAB — BASIC METABOLIC PANEL WITH GFR
Anion Gap: 5 — ABNORMAL LOW
BUN: 8 mg/dL
Calcium, Total: 8.3 mg/dL — ABNORMAL LOW
Chloride: 104 mmol/L
Co2: 27 mmol/L
Creatinine: 0.88 mg/dL
EGFR (African American): >60
EGFR (Non-African Amer.): >60
Glucose: 96 mg/dL
Osmolality: 270
Potassium: 3 mmol/L — ABNORMAL LOW
Sodium: 136 mmol/L

## 2013-08-31 LAB — CULTURE, BLOOD (SINGLE)

## 2013-09-01 LAB — CBC WITH DIFFERENTIAL/PLATELET
Basophil %: 0.8 %
Eosinophil #: 0.3 10*3/uL (ref 0.0–0.7)
Eosinophil %: 3.2 %
HGB: 9 g/dL — ABNORMAL LOW (ref 12.0–16.0)
Lymphocyte #: 1.8 10*3/uL (ref 1.0–3.6)
Lymphocyte %: 23.4 %
MCH: 33.7 pg (ref 26.0–34.0)
MCHC: 33 g/dL (ref 32.0–36.0)
Monocyte %: 10 %
Neutrophil #: 4.9 10*3/uL (ref 1.4–6.5)
Neutrophil %: 62.6 %
Platelet: 304 10*3/uL (ref 150–440)
RDW: 18 % — ABNORMAL HIGH (ref 11.5–14.5)

## 2013-09-01 LAB — BASIC METABOLIC PANEL
Anion Gap: 4 — ABNORMAL LOW (ref 7–16)
BUN: 13 mg/dL (ref 7–18)
Calcium, Total: 8.3 mg/dL — ABNORMAL LOW (ref 8.5–10.1)
Chloride: 103 mmol/L (ref 98–107)
Co2: 28 mmol/L (ref 21–32)
EGFR (African American): >60
EGFR (Non-African Amer.): >60
Osmolality: 270 (ref 275–301)
Potassium: 3.5 mmol/L (ref 3.5–5.1)
Sodium: 135 mmol/L — ABNORMAL LOW (ref 136–145)

## 2013-09-02 LAB — BASIC METABOLIC PANEL
Anion Gap: 3 — ABNORMAL LOW (ref 7–16)
BUN: 17 mg/dL (ref 7–18)
Calcium, Total: 8.5 mg/dL (ref 8.5–10.1)
Chloride: 100 mmol/L (ref 98–107)
Co2: 30 mmol/L (ref 21–32)
Creatinine: 1 mg/dL (ref 0.60–1.30)
EGFR (African American): >60
EGFR (Non-African Amer.): 55 — ABNORMAL LOW
Potassium: 3.8 mmol/L (ref 3.5–5.1)

## 2013-09-03 ENCOUNTER — Encounter: Payer: Self-pay | Admitting: Adult Health

## 2013-09-03 ENCOUNTER — Non-Acute Institutional Stay (SKILLED_NURSING_FACILITY): Payer: Medicare Other | Admitting: Adult Health

## 2013-09-03 DIAGNOSIS — E876 Hypokalemia: Secondary | ICD-10-CM | POA: Insufficient documentation

## 2013-09-03 DIAGNOSIS — R609 Edema, unspecified: Secondary | ICD-10-CM

## 2013-09-03 DIAGNOSIS — J189 Pneumonia, unspecified organism: Secondary | ICD-10-CM | POA: Insufficient documentation

## 2013-09-03 DIAGNOSIS — C50919 Malignant neoplasm of unspecified site of unspecified female breast: Secondary | ICD-10-CM

## 2013-09-03 DIAGNOSIS — I10 Essential (primary) hypertension: Secondary | ICD-10-CM

## 2013-09-03 DIAGNOSIS — D649 Anemia, unspecified: Secondary | ICD-10-CM

## 2013-09-03 DIAGNOSIS — K219 Gastro-esophageal reflux disease without esophagitis: Secondary | ICD-10-CM

## 2013-09-03 NOTE — Progress Notes (Signed)
Patient ID: Christine Strickland, female   DOB: 25-Apr-1936, 77 y.o.   MRN: [id number]     [name]  No Known Allergies   Chief Complaint  Patient presents with  . Hospitalization Follow-up    HPI:  She has been hospitalized originally for a right femur fracture while in another a snf she developed pneumonia. She returned to the hospital and is here for short term rehab then to return back home. She has been diagnosed with stage iv breast cancer in Feb 2014 she has gone through chemo and is awaiting radiation therapy.    Past Medical History  Diagnosis Date  . Clotting disorder 1999    platlets  . Scoliosis   . Breast cancer 2001    Right  . Malignant neoplasm of breast (female), unspecified site 2014    Stage 3 w/ supraclavicular metastasis.  . Femur fracture, right   . Pneumonia   . Acute respiratory failure   . COPD (chronic obstructive pulmonary disease)   . Hypertension   . Hyperlipidemia   . GERD (gastroesophageal reflux disease)   . Constipation   . Edema   . Hypokalemia   . Hypomagnesemia   . Malnutrition   . Tremor   . Dysphagia     Past Surgical History  Procedure Laterality Date  . Back surgery  2005  . Back surgery    . Breast biopsy Right 2014  . Right hip orif  [id number]  . Portacath placement      VITAL SIGNS BP 140/80  Pulse 80  Ht 5\' 5"  (1.651 m)  Wt 127 lb 3.3 oz (57.7 kg)  BMI 21.17 kg/m2   Patient's Medications  New Prescriptions   No medications on file  Previous Medications   ACETAMINOPHEN (TYLENOL) 325 MG TABLET    Take 650 mg by mouth every 4 (four) hours as needed.   AMLODIPINE (NORVASC) 5 MG TABLET    Take 1 tablet by mouth daily.   CALCIUM-VITAMIN D (OSCAL WITH D) 500-200 MG-UNIT PER TABLET    Take 1 tablet by mouth 2 (two) times daily.   CHOLECALCIFEROL (VITAMIN D) 1000 UNITS TABLET    Take 2,000 Units by mouth daily.   FERROUS SULFATE 325 (65 FE) MG TABLET    Take 325 mg by mouth 2 (two) times daily with a meal.   LEVOFLOXACIN  (LEVAQUIN) 750 MG TABLET    Take 750 mg by mouth daily.   MAGNESIUM OXIDE (MAG-OX) 400 MG TABLET    Take 400 mg by mouth 2 (two) times daily.   METOPROLOL SUCCINATE (TOPROL-XL) 50 MG 24 HR TABLET    Take 1 tablet by mouth daily.   NICOTINE (NICODERM CQ - DOSED IN MG/24 HOURS) 14 MG/24HR PATCH    Place 14 mg onto the skin daily.   PANTOPRAZOLE (PROTONIX) 40 MG TABLET    Take 40 mg by mouth daily.   POTASSIUM CHLORIDE SA (K-DUR,KLOR-CON) 20 MEQ TABLET    Take 20 mEq by mouth 2 (two) times daily.   SENNA (SENOKOT) 8.6 MG TABS TABLET    Take 1 tablet by mouth 2 (two) times daily as needed for mild constipation.   TORSEMIDE (DEMADEX) 20 MG TABLET    Take 20 mg by mouth daily.  Modified Medications   No medications on file  Discontinued Medications   ANAGRELIDE (AGRYLIN) 0.5 MG CAPSULE    Take 0.5 mg by mouth daily.   ASCORBIC ACID (VITAMIN C PO)    Take 1 tablet by  mouth daily.   BIOTIN 1000 MCG TABLET    Take 1,000 mcg by mouth daily.   CELEBREX 200 MG CAPSULE    Take 1 capsule by mouth daily.   LACTULOSE (CHRONULAC) 10 GM/15ML SOLUTION       TRAMADOL (ULTRAM) 50 MG TABLET    Take 1 tablet by mouth daily.   VITAMIN E (VITAMIN E) 400 UNIT CAPSULE    Take 400 Units by mouth daily.    SIGNIFICANT DIAGNOSTIC EXAMS  08-01-13: right femur x-ray: orif of proximal right femur  08-26-13: ct of head: mild diffuse atrophy with rather minimal periventricular small vessel disease. Bones appear somewhat osteoporotic. Air fluid level in te left maxillary antrum consistent with acute paranasal sinus disease.   08-26-13: chest x-ray: left lower pneumonia and associated pleural effusion.   08-29-13: chest x-ray; 1. Stable to mildly increased retrocardiac opacity in the left lower lobe. 2. Similar appearance of moderate bilateral pleural effusion. 3. Increased pulmonary vascular congestion and mild interstitial opacity in teh right lower lung; which may reflect mild edema    LABS REVIEWED:   09-02-13: wbc  12.6; hgb 9.0; hct 26.9; mcv 103; plt 362; glucose 97; bun 8; creat 0.85;k+3.4; na++134 alk phos 120; albumin 2.4    Review of Systems  Constitutional: Positive for malaise/fatigue.  Eyes: Negative for blurred vision.  Respiratory: Negative for cough, shortness of breath and wheezing.   Cardiovascular: Negative for chest pain.  Gastrointestinal: Negative for heartburn and abdominal pain.  Musculoskeletal: Negative for back pain, joint pain and myalgias.  Skin: Negative.   Neurological: Negative for headaches.  Psychiatric/Behavioral: Negative for depression. The patient is not nervous/anxious.     Physical Exam  Constitutional: She is oriented to person, place, and time. No distress.  thin  Neck: Neck supple. No JVD present. No thyromegaly present.  Cardiovascular: Normal rate, regular rhythm and intact distal pulses.   Respiratory: Effort normal and breath sounds normal. No respiratory distress. She has no wheezes.  GI: Soft. Bowel sounds are normal. She exhibits no distension. There is no tenderness.  Musculoskeletal: Normal range of motion. She exhibits no edema.  Has mild generalized weakness  Neurological: She is alert and oriented to person, place, and time.  Skin: Skin is warm. She is not diaphoretic.  Psychiatric: She has a normal mood and affect.    ASSESSMENT/ PLAN:  1. Hypertension: is stable will continue norvasc 5 mg daily and toprol xl 50 mg daily and will monitor her status.   2. Anemia: she has developed this from her chemo; will continue iron twice daily  3. Pneumonia: is greatly improved; will continue the levaquin 750 mg daily for 6 days and will continue to monitor her status   4. Gerd: will continue protonix 40 mg daily   5. Edema: she is stable will continue demadex 20 mg daily  6. Hypokalemia: will k+ 20 meq twice daily  7. Hypomagnesemia: will continue mag ox 400 mg twice daily   8. Breast cancer: she is present stable she has completed her chemo;  will not make changes to her current treatment and will monitor her status.    Time spent with patient 50 minutes.

## 2013-09-05 ENCOUNTER — Non-Acute Institutional Stay (SKILLED_NURSING_FACILITY): Payer: Medicare Other | Admitting: Internal Medicine

## 2013-09-05 ENCOUNTER — Other Ambulatory Visit: Payer: Self-pay | Admitting: *Deleted

## 2013-09-05 DIAGNOSIS — R609 Edema, unspecified: Secondary | ICD-10-CM

## 2013-09-05 DIAGNOSIS — R5381 Other malaise: Secondary | ICD-10-CM

## 2013-09-05 DIAGNOSIS — J189 Pneumonia, unspecified organism: Secondary | ICD-10-CM

## 2013-09-05 DIAGNOSIS — R531 Weakness: Secondary | ICD-10-CM

## 2013-09-05 DIAGNOSIS — I10 Essential (primary) hypertension: Secondary | ICD-10-CM

## 2013-09-05 DIAGNOSIS — D630 Anemia in neoplastic disease: Secondary | ICD-10-CM

## 2013-09-05 MED ORDER — TRAMADOL HCL 50 MG PO TABS: ORAL_TABLET | ORAL | Status: DC

## 2013-09-05 NOTE — Progress Notes (Signed)
Patient ID: Christine Strickland, female   DOB: Mar 31, 1936, 77 y.o.   MRN: [id number]    [facility]    PCP: Danella Penton., MD  Code Status: DNR  No Known Allergies  Chief Complaint: new admission  HPI:  77 y/o female patient is here for STR after hospital admission from 08/26/13- 09/02/13 with altered mental status and respiratory failure. She was diagnosed to have left lower lobe pneumonia and was started on iv antibiotics. She was later switched to oral antibiotics with clinical improvement. Her lytes were repleted in the hospital. Given her weakness, she was sent to the SNF. She was residing in another SNF prior to this hospital admission after a right femur fracture. She has hx of breast cancer diagnosed in feb 2014, undergone chemotherapy and awaits radiation therapy She was seen in her room today. She complaints of increased swelling in her legs. She feels weak and is tired this morning. No other complaints  Review of Systems  Constitutional: Negative for fever, chills, weight loss, malaise/fatigue and diaphoresis.  HENT: Negative for congestion, hearing loss and sore throat.   Eyes: Negative for blurred vision, double vision and discharge.  Respiratory: Negative for cough, sputum production, shortness of breath and wheezing.   Cardiovascular: Negative for chest pain, palpitations, orthopnea  Gastrointestinal: Negative for heartburn, nausea, vomiting, abdominal pain, diarrhea and constipation.  Genitourinary: Negative for dysuria, urgency, frequency and flank pain.  Musculoskeletal: Negative for back pain, falls, joint pain and myalgias.  Skin: Negative for itching and rash.  Neurological: Positive for weakness. Negative for dizziness, tingling, focal weakness and headaches.  Psychiatric/Behavioral: Negative for depression and memory loss. The patient is not nervous/anxious.     Past Medical History  Diagnosis Date  . Clotting disorder 1999    platlets  . Scoliosis   .  Breast cancer 2001    Right  . Malignant neoplasm of breast (female), unspecified site 2014    Stage 3 w/ supraclavicular metastasis.  . Femur fracture, right   . Pneumonia   . Acute respiratory failure   . COPD (chronic obstructive pulmonary disease)   . Hypertension   . Hyperlipidemia   . GERD (gastroesophageal reflux disease)   . Constipation   . Edema   . Hypokalemia   . Hypomagnesemia   . Malnutrition   . Tremor   . Dysphagia    Past Surgical History  Procedure Laterality Date  . Back surgery  2005  . Back surgery    . Breast biopsy Right 2014  . Right hip orif  [id number]  . Portacath placement     Social History:   reports that she has been smoking.  She has never used smokeless tobacco. She reports that she drinks about 8.4 ounces of alcohol per week. She reports that she does not use illicit drugs.  No family history on file.  Medications: Patient's Medications  New Prescriptions   No medications on file  Previous Medications   ACETAMINOPHEN (TYLENOL) 325 MG TABLET    Take 650 mg by mouth every 4 (four) hours as needed.   AMLODIPINE (NORVASC) 5 MG TABLET    Take 1 tablet by mouth daily.   CALCIUM-VITAMIN D (OSCAL WITH D) 500-200 MG-UNIT PER TABLET    Take 1 tablet by mouth 2 (two) times daily.   CHOLECALCIFEROL (VITAMIN D) 1000 UNITS TABLET    Take 2,000 Units by mouth daily.   FERROUS SULFATE 325 (65 FE) MG TABLET    Take 325 mg  by mouth 2 (two) times daily with a meal.   LEVOFLOXACIN (LEVAQUIN) 750 MG TABLET    Take 750 mg by mouth daily.   MAGNESIUM OXIDE (MAG-OX) 400 MG TABLET    Take 400 mg by mouth 2 (two) times daily.   METOPROLOL SUCCINATE (TOPROL-XL) 50 MG 24 HR TABLET    Take 1 tablet by mouth daily.   NICOTINE (NICODERM CQ - DOSED IN MG/24 HOURS) 14 MG/24HR PATCH    Place 14 mg onto the skin daily.   PANTOPRAZOLE (PROTONIX) 40 MG TABLET    Take 40 mg by mouth daily.   POTASSIUM CHLORIDE SA (K-DUR,KLOR-CON) 20 MEQ TABLET    Take 20 mEq by mouth 2 (two)  times daily.   SENNA (SENOKOT) 8.6 MG TABS TABLET    Take 1 tablet by mouth 2 (two) times daily as needed for mild constipation.   TORSEMIDE (DEMADEX) 20 MG TABLET    Take 20 mg by mouth daily.  Modified Medications   No medications on file  Discontinued Medications   No medications on file    Physical Exam: BP 120/78  Pulse 88  Temp(Src) 97.9 F (36.6 C)  Resp 20  SpO2 98%  General- elderly female in no acute distress Head- atraumatic, normocephalic Eyes- PERRLA, EOMI, no pallor, no icterus, no discharge Neck- no lymphadenopathy, no thyromegaly, no jugular vein distension, no carotid bruit Cardiovascular- normal s1,s2, no murmurs/ rubs/ gallops, 1 + pitting edema bilaterally Respiratory- bilateral clear to auscultation, no wheeze, no rhonchi, no crackles Abdomen- bowel sounds present, soft, non tender Musculoskeletal- able to move all 4 extremities, weakness noted Neurological- no focal deficit Psychiatry- alert and oriented to person, place and time, normal mood and affect   Labs reviewed: 09-02-13: wbc 12.6; hgb 9.0; hct 26.9; mcv 103; plt 362; glucose 97; bun 8; creat 0.85;k+3.4; na++134 alk phos 120; albumin 2.4    Radiological Exams: 08-01-13: right femur x-ray: orif of proximal right femur  08-26-13: ct of head: mild diffuse atrophy with rather minimal periventricular small vessel disease. Bones appear somewhat osteoporotic. Air fluid level in te left maxillary antrum consistent with acute paranasal sinus disease.   08-26-13: chest x-ray: left lower pneumonia and associated pleural effusion.   08-29-13: chest x-ray; 1. Stable to mildly increased retrocardiac opacity in the left lower lobe. 2. Similar appearance of moderate bilateral pleural effusion. 3. Increased pulmonary vascular congestion and mild interstitial opacity in teh right lower lung; which may reflect mild edema    Assessment/Plan  Generalized weakness- to work with PT and OT, gait training, fall  precautions  Edema- keep legs elevated at rest and to apply ted hose. Increase torsemide to 30 mg daily. Continue kcl supplement and monitor bmp and mg level  Pneumonia- stop levofloxacin after 09/07/13. Monitor clinically  Hypertension- continue norvasc 5 mg daily and toprol xl 50 mg daily and will monitor her status.   Anemia- continue iron twice daily and monitor cbc  Genella Rife- continue protonix 40 mg daily     Family/ staff Communication: reviewed care plan with patient and nursing supervisor   Goals of care: to return home   Labs/tests ordered: cbc, cmp, mg

## 2013-09-09 ENCOUNTER — Non-Acute Institutional Stay (SKILLED_NURSING_FACILITY): Payer: Medicare Other | Admitting: Adult Health

## 2013-09-09 DIAGNOSIS — B373 Candidiasis of vulva and vagina: Secondary | ICD-10-CM

## 2013-09-09 DIAGNOSIS — B3731 Acute candidiasis of vulva and vagina: Secondary | ICD-10-CM

## 2013-09-15 ENCOUNTER — Encounter: Payer: Self-pay | Admitting: Adult Health

## 2013-09-15 NOTE — Progress Notes (Signed)
Patient ID: Christine Strickland, female   DOB: [birth year], 77 y.o.   MRN: [id number]     [facility]  No Known Allergies   Chief Complaint  Patient presents with  . Acute Visit    vaginal itching    HPI:  She is complaining of vaginal itching present. She states every time she takes abt; she get vaginal yeast infections. She states she has had these for the past several days.    Past Medical History  Diagnosis Date  . Clotting disorder 1999    platlets  . Scoliosis   . Breast cancer 2001    Right  . Malignant neoplasm of breast (female), unspecified site 2014    Stage 3 w/ supraclavicular metastasis.  . Femur fracture, right   . Pneumonia   . Acute respiratory failure   . COPD (chronic obstructive pulmonary disease)   . Hypertension   . Hyperlipidemia   . GERD (gastroesophageal reflux disease)   . Constipation   . Edema   . Hypokalemia   . Hypomagnesemia   . Malnutrition   . Tremor   . Dysphagia     Past Surgical History  Procedure Laterality Date  . Back surgery  2005  . Back surgery    . Breast biopsy Right 2014  . Right hip orif  [id number]  . Portacath placement      VITAL SIGNS BP 127/76  Pulse 72  Ht 5\' 5"  (1.651 m)  Wt 134 lb (60.782 kg)  BMI 22.30 kg/m2   Patient's Medications  New Prescriptions   No medications on file  Previous Medications   ACETAMINOPHEN (TYLENOL) 325 MG TABLET    Take 650 mg by mouth every 4 (four) hours as needed.   AMLODIPINE (NORVASC) 5 MG TABLET    Take 1 tablet by mouth daily.   CALCIUM-VITAMIN D (OSCAL WITH D) 500-200 MG-UNIT PER TABLET    Take 1 tablet by mouth 2 (two) times daily.   CHOLECALCIFEROL (VITAMIN D) 1000 UNITS TABLET    Take 2,000 Units by mouth daily.   FERROUS SULFATE 325 (65 FE) MG TABLET    Take 325 mg by mouth 2 (two) times daily with a meal.   FUROSEMIDE (LASIX) 20 MG TABLET    Take 30 mg by mouth daily.   MAGNESIUM OXIDE (MAG-OX) 400 MG TABLET    Take 400 mg by mouth 2 (two) times daily.   METOPROLOL  SUCCINATE (TOPROL-XL) 50 MG 24 HR TABLET    Take 1 tablet by mouth daily.   NICOTINE (NICODERM CQ - DOSED IN MG/24 HOURS) 14 MG/24HR PATCH    Place 14 mg onto the skin daily.   PANTOPRAZOLE (PROTONIX) 40 MG TABLET    Take 40 mg by mouth daily.   POTASSIUM CHLORIDE SA (K-DUR,KLOR-CON) 20 MEQ TABLET    Take 20 mEq by mouth 2 (two) times daily.   SENNA (SENOKOT) 8.6 MG TABS TABLET    Take 1 tablet by mouth 2 (two) times daily as needed for mild constipation.   TORSEMIDE (DEMADEX) 20 MG TABLET    Take 20 mg by mouth daily.   TRAMADOL (ULTRAM) 50 MG TABLET    Take one tablet by mouth every 6 hours as needed for hip pain  Modified Medications   No medications on file  Discontinued Medications   No medications on file    SIGNIFICANT DIAGNOSTIC EXAMS  08-01-13: right femur x-ray: orif of proximal right femur  08-26-13: ct of head: mild diffuse atrophy with  rather minimal periventricular small vessel disease. Bones appear somewhat osteoporotic. Air fluid level in te left maxillary antrum consistent with acute paranasal sinus disease.   08-26-13: chest x-ray: left lower pneumonia and associated pleural effusion.   08-29-13: chest x-ray; 1. Stable to mildly increased retrocardiac opacity in the left lower lobe. 2. Similar appearance of moderate bilateral pleural effusion. 3. Increased pulmonary vascular congestion and mild interstitial opacity in teh right lower lung; which may reflect mild edema   09-07-13: right hip x-ray: post surgical changes without evidence of acute osseous pathology    LABS REVIEWED:   09-02-13: wbc 12.6; hgb 9.0; hct 26.9; mcv 103; plt 362; glucose 97; bun 8; creat 0.85;k+3.4; na++134 alk phos 120; albumin 2.4   Review of Systems  Constitutional: Negative for malaise/fatigue.  Respiratory: Negative for cough.   Cardiovascular: Negative for chest pain, palpitations and leg swelling.  Gastrointestinal: Negative for heartburn.  Genitourinary:       Vaginal itching;  irritation; drainage  Skin: Negative.   Neurological: Negative for headaches.  Psychiatric/Behavioral: Negative for depression.     Physical Exam Constitutional: She is oriented to person, place, and time. No distress.  thin  Neck: Neck supple. No JVD present. No thyromegaly present.  Cardiovascular: Normal rate, regular rhythm and intact distal pulses.   Respiratory: Effort normal and breath sounds normal. No respiratory distress. She has no wheezes.  GI: Soft. Bowel sounds are normal. She exhibits no distension. There is no tenderness.  Musculoskeletal: Normal range of motion. She exhibits no edema.  Has mild generalized weakness  Neurological: She is alert and oriented to person, place, and time.  Skin: Skin is warm. She is not diaphoretic. vaginal area is red inflamed with thick white drainage present.  Psychiatric: She has a normal mood and affect.       ASSESSMENT/ PLAN:  1. Vaginal candidiasis: will begin diflucan 200 mg daily for 4 days; and monistat external cream every 4 hours as needed for itching will monitor

## 2013-09-18 ENCOUNTER — Non-Acute Institutional Stay (SKILLED_NURSING_FACILITY): Payer: Medicare Other | Admitting: Adult Health

## 2013-09-18 DIAGNOSIS — S7290XD Unspecified fracture of unspecified femur, subsequent encounter for closed fracture with routine healing: Secondary | ICD-10-CM

## 2013-09-18 DIAGNOSIS — S7291XD Unspecified fracture of right femur, subsequent encounter for closed fracture with routine healing: Secondary | ICD-10-CM

## 2013-09-19 ENCOUNTER — Encounter: Payer: Self-pay | Admitting: Adult Health

## 2013-09-19 ENCOUNTER — Encounter: Payer: Self-pay | Admitting: Internal Medicine

## 2013-09-19 DIAGNOSIS — S7291XA Unspecified fracture of right femur, initial encounter for closed fracture: Secondary | ICD-10-CM | POA: Insufficient documentation

## 2013-09-19 MED ORDER — TRAMADOL HCL 50 MG PO TABS: 50.0000 mg | ORAL_TABLET | Freq: Four times a day (QID) | ORAL | Status: AC

## 2013-09-19 NOTE — Progress Notes (Signed)
Patient ID: Christine Strickland, female   DOB: [birth year], 78 y.o.   MRN: [id number]     [facility]  No Known Allergies   Chief Complaint  Patient presents with  . Acute Visit    pain management     HPI:  She is having a great deal of right hip pain. Due to her pain she is having increased difficulty participating in therapy and is spending more time in bed. Nursing and therapy is concerned about her overall health and that she will start to significantly decline if she does not start to participate in therapy more aggressively soon.  She states that she does not remember to ask for her pain medication until her pain is severe and is therefore not as effective as it should be. She would like to have the pain medication scheduled at this time. She does not like oxycodone as this medication makes her confused.   Past Medical History  Diagnosis Date  . Clotting disorder 1999    platlets  . Scoliosis   . Breast cancer 2001    Right  . Malignant neoplasm of breast (female), unspecified site 2014    Stage 3 w/ supraclavicular metastasis.  . Femur fracture, right   . Pneumonia   . Acute respiratory failure   . COPD (chronic obstructive pulmonary disease)   . Hypertension   . Hyperlipidemia   . GERD (gastroesophageal reflux disease)   . Constipation   . Edema   . Hypokalemia   . Hypomagnesemia   . Malnutrition   . Tremor   . Dysphagia     Past Surgical History  Procedure Laterality Date  . Back surgery  2005  . Back surgery    . Breast biopsy Right 2014  . Right hip orif  [id number]  . Portacath placement      VITAL SIGNS BP 123/74  Pulse 78  Ht 5' 5"  (1.651 m)  Wt 118 lb 11.2 oz (53.842 kg)  BMI 19.75 kg/m2   Patient's Medications  New Prescriptions   No medications on file  Previous Medications   ACETAMINOPHEN (TYLENOL) 325 MG TABLET    Take 650 mg by mouth every 4 (four) hours as needed.   AMLODIPINE (NORVASC) 5 MG TABLET    Take 1 tablet by mouth daily.   CALCIUM-VITAMIN D (OSCAL WITH D) 500-200 MG-UNIT PER TABLET    Take 1 tablet by mouth 2 (two) times daily.   CHOLECALCIFEROL (VITAMIN D) 1000 UNITS TABLET    Take 2,000 Units by mouth daily.   FERROUS SULFATE 325 (65 FE) MG TABLET    Take 325 mg by mouth 2 (two) times daily with a meal.   FUROSEMIDE (LASIX) 20 MG TABLET    Take 30 mg by mouth daily.   MAGNESIUM OXIDE (MAG-OX) 400 MG TABLET    Take 400 mg by mouth 2 (two) times daily.   METOPROLOL SUCCINATE (TOPROL-XL) 50 MG 24 HR TABLET    Take 1 tablet by mouth daily.   NICOTINE (NICODERM CQ - DOSED IN MG/24 HOURS) 14 MG/24HR PATCH    Place 14 mg onto the skin daily.   PANTOPRAZOLE (PROTONIX) 40 MG TABLET    Take 40 mg by mouth daily.   POTASSIUM CHLORIDE SA (K-DUR,KLOR-CON) 20 MEQ TABLET    Take 20 mEq by mouth 2 (two) times daily.   SENNA (SENOKOT) 8.6 MG TABS TABLET    Take 1 tablet by mouth 2 (two) times daily as needed for mild constipation.  TORSEMIDE (DEMADEX) 20 MG TABLET    Take 20 mg by mouth daily.   TRAMADOL (ULTRAM) 50 MG TABLET    Take one tablet by mouth every 6 hours as needed for hip pain  Modified Medications   No medications on file  Discontinued Medications   No medications on file    SIGNIFICANT DIAGNOSTIC EXAMS  08-01-13: right femur x-ray: orif of proximal right femur  08-26-13: ct of head: mild diffuse atrophy with rather minimal periventricular small vessel disease. Bones appear somewhat osteoporotic. Air fluid level in te left maxillary antrum consistent with acute paranasal sinus disease.   08-26-13: chest x-ray: left lower pneumonia and associated pleural effusion.   08-29-13: chest x-ray; 1. Stable to mildly increased retrocardiac opacity in the left lower lobe. 2. Similar appearance of moderate bilateral pleural effusion. 3. Increased pulmonary vascular congestion and mild interstitial opacity in teh right lower lung; which may reflect mild edema   09-07-13: right hip x-ray: post surgical changes without  evidence of acute osseous pathology    LABS REVIEWED:   09-02-13: wbc 12.6; hgb 9.0; hct 26.9; mcv 103; plt 362; glucose 97; bun 8; creat 0.85;k+3.4; na++134 alk phos 120; albumin 2.4  09-09-13: wbc 9.3; hgb9.5; hct 29.7; mcv 102.4;plt 412; glucose 100; bun 17;creat 1.0; k+3.8;na++132  Liver normal albumin 2.6; mag 1.9      Review of Systems  Constitutional: Positive for malaise/fatigue.  Respiratory: Negative for cough and shortness of breath.   Cardiovascular: Negative for chest pain, palpitations and leg swelling.  Gastrointestinal: Negative for heartburn and abdominal pain.  Musculoskeletal: Positive for back pain and joint pain. Negative for myalgias.  Skin: Negative.   Neurological: Negative for headaches.  Psychiatric/Behavioral: Negative for depression. The patient is not nervous/anxious.       Physical Exam  Constitutional: She is oriented to person, place, and time. No distress.  thin  Neck: Neck supple. No JVD present. No thyromegaly present.  Cardiovascular: Normal rate, regular rhythm and intact distal pulses.   Respiratory: Effort normal and breath sounds normal. No respiratory distress. She has no wheezes.  GI: Soft. Bowel sounds are normal. She exhibits no distension. There is no tenderness.  Musculoskeletal: Normal range of motion. She exhibits no edema.  Has mild generalized weakness  Neurological: She is alert and oriented to person, place, and time.  Skin: Skin is warm. She is not diaphoretic.  Psychiatric: She has a normal mood and affect.     ASSESSMENT/ PLAN:  1. Right femur fracture: will continue therapy as directed; will change her ultram to 50 mg every 6 hours routinely; so she will not have to remember to ask for her pain medication. Will continue to monitor her pain response and will make further changes as indicated.   Time spent with patient 30 minutes.

## 2013-10-09 ENCOUNTER — Non-Acute Institutional Stay (SKILLED_NURSING_FACILITY): Payer: Medicare Other | Admitting: Adult Health

## 2013-10-09 DIAGNOSIS — I10 Essential (primary) hypertension: Secondary | ICD-10-CM

## 2013-10-09 DIAGNOSIS — S7290XA Unspecified fracture of unspecified femur, initial encounter for closed fracture: Secondary | ICD-10-CM

## 2013-10-09 DIAGNOSIS — S7291XA Unspecified fracture of right femur, initial encounter for closed fracture: Secondary | ICD-10-CM

## 2013-10-09 DIAGNOSIS — C50919 Malignant neoplasm of unspecified site of unspecified female breast: Secondary | ICD-10-CM

## 2013-10-13 ENCOUNTER — Encounter: Payer: Self-pay | Admitting: Adult Health

## 2013-10-13 NOTE — Progress Notes (Signed)
Patient ID: Christine Strickland, female   DOB: [birth year], 78 y.o.   MRN: [id number]     [facility]   No Known Allergies     Chief Complaint  Patient presents with  . Discharge Note    HPI:  She is being discharged to home with home health for pt/ot/nursing. She will need a light weight wheelchair in order for her to maintain her current level of independence with her adl's that she cannot maintain with a walker. She will need prescriptions to be written.   Past Medical History  Diagnosis Date  . Clotting disorder 1999    platlets  . Scoliosis   . Breast cancer 2001    Right  . Malignant neoplasm of breast (female), unspecified site 2014    Stage 3 w/ supraclavicular metastasis.  . Femur fracture, right   . Pneumonia   . Acute respiratory failure   . COPD (chronic obstructive pulmonary disease)   . Hypertension   . Hyperlipidemia   . GERD (gastroesophageal reflux disease)   . Constipation   . Edema   . Hypokalemia   . Hypomagnesemia   . Malnutrition   . Tremor   . Dysphagia     Past Surgical History  Procedure Laterality Date  . Back surgery  2005  . Back surgery    . Breast biopsy Right 2014  . Right hip orif  [id number]  . Portacath placement      VITAL SIGNS BP 126/68  Pulse 72  Ht 5' 5" (1.651 m)  Wt 110 lb 1.6 oz (49.941 kg)  BMI 18.32 kg/m2   Patient's Medications  New Prescriptions   No medications on file  Previous Medications   ACETAMINOPHEN (TYLENOL) 325 MG TABLET    Take 650 mg by mouth every 4 (four) hours as needed.   AMLODIPINE (NORVASC) 5 MG TABLET    Take 1 tablet by mouth daily.   CALCIUM-VITAMIN D (OSCAL WITH D) 500-200 MG-UNIT PER TABLET    Take 1 tablet by mouth 2 (two) times daily.   CHOLECALCIFEROL (VITAMIN D) 1000 UNITS TABLET    Take 2,000 Units by mouth daily.   FERROUS SULFATE 325 (65 FE) MG TABLET    Take 325 mg by mouth 2 (two) times daily with a meal.   FUROSEMIDE (LASIX) 20 MG TABLET    Take 30 mg by mouth daily.   MAGNESIUM  OXIDE (MAG-OX) 400 MG TABLET    Take 400 mg by mouth 2 (two) times daily.   METOPROLOL SUCCINATE (TOPROL-XL) 50 MG 24 HR TABLET    Take 1 tablet by mouth daily.   NICOTINE (NICODERM CQ - DOSED IN MG/24 HOURS) 14 MG/24HR PATCH    Place 14 mg onto the skin daily.   PANTOPRAZOLE (PROTONIX) 40 MG TABLET    Take 40 mg by mouth daily.   POTASSIUM CHLORIDE SA (K-DUR,KLOR-CON) 20 MEQ TABLET    Take 20 mEq by mouth 2 (two) times daily.   SENNA (SENOKOT) 8.6 MG TABS TABLET    Take 1 tablet by mouth 2 (two) times daily as needed for mild constipation.   TORSEMIDE (DEMADEX) 20 MG TABLET    Take 20 mg by mouth daily.   TRAMADOL (ULTRAM) 50 MG TABLET    Take 1 tablet (50 mg total) by mouth every 6 (six) hours. Take one tablet by mouth every 6 hours as needed for hip pain  Modified Medications   No medications on file  Discontinued Medications   No medications  on file    SIGNIFICANT DIAGNOSTIC EXAMS  08-01-13: right femur x-ray: orif of proximal right femur  08-26-13: ct of head: mild diffuse atrophy with rather minimal periventricular small vessel disease. Bones appear somewhat osteoporotic. Air fluid level in te left maxillary antrum consistent with acute paranasal sinus disease.   08-26-13: chest x-ray: left lower pneumonia and associated pleural effusion.   08-29-13: chest x-ray; 1. Stable to mildly increased retrocardiac opacity in the left lower lobe. 2. Similar appearance of moderate bilateral pleural effusion. 3. Increased pulmonary vascular congestion and mild interstitial opacity in teh right lower lung; which may reflect mild edema   09-07-13: right hip x-ray: post surgical changes without evidence of acute osseous pathology    LABS REVIEWED:   09-02-13: wbc 12.6; hgb 9.0; hct 26.9; mcv 103; plt 362; glucose 97; bun 8; creat 0.85;k+3.4; na++134 alk phos 120; albumin 2.4  09-09-13: wbc 9.3; hgb9.5; hct 29.7; mcv 102.4;plt 412; glucose 100; bun 17;creat 1.0; k+3.8;na++132  Liver normal albumin  2.6; mag 1.9      Review of Systems  Constitutional:negetive Respiratory: Negative for cough and shortness of breath.   Cardiovascular: Negative for chest pain, palpitations and leg swelling.  Gastrointestinal: Negative for heartburn and abdominal pain.  Musculoskeletal: Positive for back /joint pain Negative for myalgias.  Skin: Negative.   Neurological: Negative for headaches.  Psychiatric/Behavioral: Negative for depression. The patient is not nervous/anxious.       Physical Exam  Constitutional: She is oriented to person, place, and time. No distress.  thin  Neck: Neck supple. No JVD present. No thyromegaly present.  Cardiovascular: Normal rate, regular rhythm and intact distal pulses.   Respiratory: Effort normal and breath sounds normal. No respiratory distress. She has no wheezes.  GI: Soft. Bowel sounds are normal. She exhibits no distension. There is no tenderness.  Musculoskeletal: Normal range of motion. She exhibits no edema.  Neurological: She is alert and oriented to person, place, and time.  Skin: Skin is warm. She is not diaphoretic.  Psychiatric: She has a normal mood and affect.     ASSESSMENT/ PLAN:  Will discharge to home with home health for pt/ot/nursing. Will need a light weight wheelchair. Her prescriptions have been written.   Time spent with patient 45 minutes.

## 2013-11-12 ENCOUNTER — Ambulatory Visit: Payer: Self-pay | Admitting: Internal Medicine

## 2013-11-17 ENCOUNTER — Ambulatory Visit: Payer: Self-pay | Admitting: Internal Medicine

## 2013-11-18 ENCOUNTER — Ambulatory Visit: Payer: Self-pay | Admitting: Radiation Oncology

## 2013-11-19 ENCOUNTER — Ambulatory Visit: Payer: Self-pay | Admitting: Internal Medicine

## 2013-11-20 LAB — COMPREHENSIVE METABOLIC PANEL
ALK PHOS: 221 U/L — AB
ALT: 28 U/L (ref 12–78)
Albumin: 3.1 g/dL — ABNORMAL LOW (ref 3.4–5.0)
Anion Gap: 4 — ABNORMAL LOW (ref 7–16)
BILIRUBIN TOTAL: 0.3 mg/dL (ref 0.2–1.0)
BUN: 14 mg/dL (ref 7–18)
CHLORIDE: 99 mmol/L (ref 98–107)
CO2: 29 mmol/L (ref 21–32)
CREATININE: 0.86 mg/dL (ref 0.60–1.30)
Calcium, Total: 8.4 mg/dL — ABNORMAL LOW (ref 8.5–10.1)
Glucose: 98 mg/dL (ref 65–99)
Osmolality: 265 (ref 275–301)
Potassium: 3.6 mmol/L (ref 3.5–5.1)
SGOT(AST): 43 U/L — ABNORMAL HIGH (ref 15–37)
Sodium: 132 mmol/L — ABNORMAL LOW (ref 136–145)
Total Protein: 6.4 g/dL (ref 6.4–8.2)

## 2013-11-20 LAB — CBC CANCER CENTER
BASOS ABS: 0 x10 3/mm (ref 0.0–0.1)
BASOS PCT: 0.3 %
EOS PCT: 3.6 %
Eosinophil #: 0.3 x10 3/mm (ref 0.0–0.7)
HCT: 36 % (ref 35.0–47.0)
HGB: 12.5 g/dL (ref 12.0–16.0)
Lymphocyte #: 2.4 x10 3/mm (ref 1.0–3.6)
Lymphocyte %: 28.5 %
MCH: 34.4 pg — ABNORMAL HIGH (ref 26.0–34.0)
MCHC: 34.8 g/dL (ref 32.0–36.0)
MCV: 99 fL (ref 80–100)
MONO ABS: 0.8 x10 3/mm (ref 0.2–0.9)
MONOS PCT: 9 %
Neutrophil #: 5 x10 3/mm (ref 1.4–6.5)
Neutrophil %: 58.6 %
Platelet: 326 x10 3/mm (ref 150–440)
RBC: 3.64 10*6/uL — ABNORMAL LOW (ref 3.80–5.20)
RDW: 16.2 % — ABNORMAL HIGH (ref 11.5–14.5)
WBC: 8.5 x10 3/mm (ref 3.6–11.0)

## 2013-11-20 LAB — MAGNESIUM: Magnesium: 1.7 mg/dL — ABNORMAL LOW

## 2013-11-21 LAB — CANCER ANTIGEN 27.29: CA 27.29: 16.5 U/mL (ref 0.0–38.6)

## 2013-11-21 LAB — CEA: CEA: 2.5 ng/mL (ref 0.0–4.7)

## 2013-12-04 LAB — CBC CANCER CENTER
BASOS PCT: 1.3 %
Basophil #: 0.2 x10 3/mm — ABNORMAL HIGH (ref 0.0–0.1)
EOS ABS: 0.3 x10 3/mm (ref 0.0–0.7)
Eosinophil %: 2.4 %
HCT: 34.5 % — ABNORMAL LOW (ref 35.0–47.0)
HGB: 11.3 g/dL — AB (ref 12.0–16.0)
Lymphocyte #: 2.9 x10 3/mm (ref 1.0–3.6)
Lymphocyte %: 25 %
MCH: 32.8 pg (ref 26.0–34.0)
MCHC: 32.6 g/dL (ref 32.0–36.0)
MCV: 100 fL (ref 80–100)
MONOS PCT: 11.7 %
Monocyte #: 1.3 x10 3/mm — ABNORMAL HIGH (ref 0.2–0.9)
NEUTROS PCT: 59.6 %
Neutrophil #: 6.8 x10 3/mm — ABNORMAL HIGH (ref 1.4–6.5)
PLATELETS: 333 x10 3/mm (ref 150–440)
RBC: 3.43 10*6/uL — AB (ref 3.80–5.20)
RDW: 15.2 % — AB (ref 11.5–14.5)
WBC: 11.5 x10 3/mm — AB (ref 3.6–11.0)

## 2013-12-18 ENCOUNTER — Ambulatory Visit: Payer: Self-pay | Admitting: Internal Medicine

## 2013-12-25 LAB — CBC CANCER CENTER
Basophil #: 0 x10 3/mm (ref 0.0–0.1)
Basophil %: 0.2 %
EOS PCT: 3.8 %
Eosinophil #: 0.4 x10 3/mm (ref 0.0–0.7)
HCT: 40.5 % (ref 35.0–47.0)
HGB: 13.4 g/dL (ref 12.0–16.0)
LYMPHS ABS: 2.4 x10 3/mm (ref 1.0–3.6)
Lymphocyte %: 25.1 %
MCH: 33.7 pg (ref 26.0–34.0)
MCHC: 33.1 g/dL (ref 32.0–36.0)
MCV: 102 fL — ABNORMAL HIGH (ref 80–100)
MONOS PCT: 9.7 %
Monocyte #: 0.9 x10 3/mm (ref 0.2–0.9)
NEUTROS ABS: 5.8 x10 3/mm (ref 1.4–6.5)
Neutrophil %: 61.2 %
Platelet: 339 x10 3/mm (ref 150–440)
RBC: 3.97 10*6/uL (ref 3.80–5.20)
RDW: 15.2 % — AB (ref 11.5–14.5)
WBC: 9.5 x10 3/mm (ref 3.6–11.0)

## 2013-12-25 LAB — HEPATIC FUNCTION PANEL A (ARMC)
Albumin: 3.3 g/dL — ABNORMAL LOW (ref 3.4–5.0)
Alkaline Phosphatase: 188 U/L — ABNORMAL HIGH
BILIRUBIN TOTAL: 0.4 mg/dL (ref 0.2–1.0)
Bilirubin, Direct: 0.1 mg/dL (ref 0.00–0.20)
SGOT(AST): 25 U/L (ref 15–37)
SGPT (ALT): 17 U/L (ref 12–78)
TOTAL PROTEIN: 6.7 g/dL (ref 6.4–8.2)

## 2013-12-25 LAB — CREATININE, SERUM
Creatinine: 1.04 mg/dL (ref 0.60–1.30)
EGFR (African American): >60
EGFR (Non-African Amer.): 53 — ABNORMAL LOW

## 2013-12-31 LAB — CBC CANCER CENTER
Basophil #: 0.1 x10 3/mm (ref 0.0–0.1)
Basophil %: 1.5 %
EOS PCT: 3.5 %
Eosinophil #: 0.3 x10 3/mm (ref 0.0–0.7)
HCT: 41.2 % (ref 35.0–47.0)
HGB: 13.7 g/dL (ref 12.0–16.0)
LYMPHS PCT: 26.9 %
Lymphocyte #: 2.6 x10 3/mm (ref 1.0–3.6)
MCH: 33.7 pg (ref 26.0–34.0)
MCHC: 33.2 g/dL (ref 32.0–36.0)
MCV: 102 fL — AB (ref 80–100)
MONOS PCT: 8.1 %
Monocyte #: 0.8 x10 3/mm (ref 0.2–0.9)
Neutrophil #: 5.9 x10 3/mm (ref 1.4–6.5)
Neutrophil %: 60 %
Platelet: 351 x10 3/mm (ref 150–440)
RBC: 4.06 10*6/uL (ref 3.80–5.20)
RDW: 15.5 % — ABNORMAL HIGH (ref 11.5–14.5)
WBC: 9.8 x10 3/mm (ref 3.6–11.0)

## 2013-12-31 LAB — HEPATIC FUNCTION PANEL A (ARMC)
ALT: 16 U/L (ref 12–78)
AST: 23 U/L (ref 15–37)
Albumin: 3.6 g/dL (ref 3.4–5.0)
Alkaline Phosphatase: 196 U/L — ABNORMAL HIGH
Bilirubin, Direct: 0.2 mg/dL (ref 0.00–0.20)
Bilirubin,Total: 0.7 mg/dL (ref 0.2–1.0)
Total Protein: 7.2 g/dL (ref 6.4–8.2)

## 2014-01-06 LAB — COMPREHENSIVE METABOLIC PANEL
ALBUMIN: 3.3 g/dL — AB (ref 3.4–5.0)
ALK PHOS: 172 U/L — AB
AST: 25 U/L (ref 15–37)
Anion Gap: 6 — ABNORMAL LOW (ref 7–16)
BILIRUBIN TOTAL: 0.4 mg/dL (ref 0.2–1.0)
BUN: 14 mg/dL (ref 7–18)
CO2: 29 mmol/L (ref 21–32)
Calcium, Total: 7.8 mg/dL — ABNORMAL LOW (ref 8.5–10.1)
Chloride: 100 mmol/L (ref 98–107)
Creatinine: 0.86 mg/dL (ref 0.60–1.30)
EGFR (African American): >60
EGFR (Non-African Amer.): >60
Glucose: 93 mg/dL (ref 65–99)
Osmolality: 270 (ref 275–301)
Potassium: 4.6 mmol/L (ref 3.5–5.1)
SGPT (ALT): 17 U/L (ref 12–78)
SODIUM: 135 mmol/L — AB (ref 136–145)
TOTAL PROTEIN: 6.5 g/dL (ref 6.4–8.2)

## 2014-01-06 LAB — CBC CANCER CENTER
BASOS PCT: 0.2 %
Basophil #: 0 x10 3/mm (ref 0.0–0.1)
Eosinophil #: 0.3 x10 3/mm (ref 0.0–0.7)
Eosinophil %: 3.2 %
HCT: 39.1 % (ref 35.0–47.0)
HGB: 12.9 g/dL (ref 12.0–16.0)
Lymphocyte #: 2.4 x10 3/mm (ref 1.0–3.6)
Lymphocyte %: 26.8 %
MCH: 34.2 pg — ABNORMAL HIGH (ref 26.0–34.0)
MCHC: 33.1 g/dL (ref 32.0–36.0)
MCV: 103 fL — ABNORMAL HIGH (ref 80–100)
MONO ABS: 0.8 x10 3/mm (ref 0.2–0.9)
MONOS PCT: 9.3 %
NEUTROS ABS: 5.4 x10 3/mm (ref 1.4–6.5)
Neutrophil %: 60.5 %
Platelet: 306 x10 3/mm (ref 150–440)
RBC: 3.78 10*6/uL — AB (ref 3.80–5.20)
RDW: 16.3 % — ABNORMAL HIGH (ref 11.5–14.5)
WBC: 9 x10 3/mm (ref 3.6–11.0)

## 2014-01-06 LAB — PHOSPHORUS: Phosphorus: 2.4 mg/dL — ABNORMAL LOW (ref 2.5–4.9)

## 2014-01-13 LAB — CBC CANCER CENTER
Basophil #: 0 x10 3/mm (ref 0.0–0.1)
Basophil %: 0.2 %
EOS PCT: 4.1 %
Eosinophil #: 0.4 x10 3/mm (ref 0.0–0.7)
HCT: 38.8 % (ref 35.0–47.0)
HGB: 13.1 g/dL (ref 12.0–16.0)
LYMPHS PCT: 30.3 %
Lymphocyte #: 2.6 x10 3/mm (ref 1.0–3.6)
MCH: 34.6 pg — AB (ref 26.0–34.0)
MCHC: 33.8 g/dL (ref 32.0–36.0)
MCV: 103 fL — AB (ref 80–100)
MONO ABS: 1 x10 3/mm — AB (ref 0.2–0.9)
MONOS PCT: 11.2 %
Neutrophil #: 4.7 x10 3/mm (ref 1.4–6.5)
Neutrophil %: 54.2 %
PLATELETS: 272 x10 3/mm (ref 150–440)
RBC: 3.79 10*6/uL — AB (ref 3.80–5.20)
RDW: 16.3 % — ABNORMAL HIGH (ref 11.5–14.5)
WBC: 8.7 x10 3/mm (ref 3.6–11.0)

## 2014-01-13 LAB — HEPATIC FUNCTION PANEL A (ARMC)
ALBUMIN: 3.3 g/dL — AB (ref 3.4–5.0)
ALK PHOS: 167 U/L — AB
AST: 26 U/L (ref 15–37)
BILIRUBIN TOTAL: 0.6 mg/dL (ref 0.2–1.0)
Bilirubin, Direct: 0.1 mg/dL (ref 0.00–0.20)
SGPT (ALT): 18 U/L (ref 12–78)
TOTAL PROTEIN: 6.5 g/dL (ref 6.4–8.2)

## 2014-01-13 LAB — CREATININE, SERUM
Creatinine: 0.9 mg/dL (ref 0.60–1.30)
EGFR (African American): >60
EGFR (Non-African Amer.): >60

## 2014-01-17 ENCOUNTER — Ambulatory Visit: Payer: Self-pay | Admitting: Internal Medicine

## 2014-01-20 LAB — CBC CANCER CENTER
BASOS PCT: 1.4 %
Basophil #: 0.1 x10 3/mm (ref 0.0–0.1)
EOS ABS: 0.3 x10 3/mm (ref 0.0–0.7)
Eosinophil %: 3 %
HCT: 36.6 % (ref 35.0–47.0)
HGB: 12.7 g/dL (ref 12.0–16.0)
LYMPHS ABS: 2.7 x10 3/mm (ref 1.0–3.6)
Lymphocyte %: 28.1 %
MCH: 35.6 pg — AB (ref 26.0–34.0)
MCHC: 34.7 g/dL (ref 32.0–36.0)
MCV: 103 fL — ABNORMAL HIGH (ref 80–100)
MONO ABS: 0.7 x10 3/mm (ref 0.2–0.9)
MONOS PCT: 7.8 %
Neutrophil #: 5.7 x10 3/mm (ref 1.4–6.5)
Neutrophil %: 59.7 %
Platelet: 296 x10 3/mm (ref 150–440)
RBC: 3.56 10*6/uL — AB (ref 3.80–5.20)
RDW: 16.6 % — AB (ref 11.5–14.5)
WBC: 9.5 x10 3/mm (ref 3.6–11.0)

## 2014-01-20 LAB — CREATININE, SERUM
Creatinine: 0.8 mg/dL (ref 0.60–1.30)
EGFR (African American): >60
EGFR (Non-African Amer.): >60

## 2014-01-20 LAB — HEPATIC FUNCTION PANEL A (ARMC)
ALBUMIN: 3.2 g/dL — AB (ref 3.4–5.0)
Alkaline Phosphatase: 175 U/L — ABNORMAL HIGH
BILIRUBIN DIRECT: 0.1 mg/dL (ref 0.00–0.20)
BILIRUBIN TOTAL: 0.5 mg/dL (ref 0.2–1.0)
SGOT(AST): 22 U/L (ref 15–37)
SGPT (ALT): 18 U/L (ref 12–78)
Total Protein: 6.2 g/dL — ABNORMAL LOW (ref 6.4–8.2)

## 2014-01-27 LAB — CBC CANCER CENTER
BASOS PCT: 0.4 %
Basophil #: 0 x10 3/mm (ref 0.0–0.1)
EOS ABS: 0.1 x10 3/mm (ref 0.0–0.7)
Eosinophil %: 1.1 %
HCT: 40.4 % (ref 35.0–47.0)
HGB: 13.6 g/dL (ref 12.0–16.0)
LYMPHS PCT: 11.8 %
Lymphocyte #: 1.6 x10 3/mm (ref 1.0–3.6)
MCH: 35.5 pg — ABNORMAL HIGH (ref 26.0–34.0)
MCHC: 33.6 g/dL (ref 32.0–36.0)
MCV: 106 fL — AB (ref 80–100)
Monocyte #: 0.8 x10 3/mm (ref 0.2–0.9)
Monocyte %: 5.8 %
Neutrophil #: 10.8 x10 3/mm — ABNORMAL HIGH (ref 1.4–6.5)
Neutrophil %: 80.9 %
Platelet: 342 x10 3/mm (ref 150–440)
RBC: 3.83 10*6/uL (ref 3.80–5.20)
RDW: 18.2 % — ABNORMAL HIGH (ref 11.5–14.5)
WBC: 13.3 x10 3/mm — AB (ref 3.6–11.0)

## 2014-01-27 LAB — HEPATIC FUNCTION PANEL A (ARMC)
ALBUMIN: 3.6 g/dL (ref 3.4–5.0)
ALK PHOS: 176 U/L — AB
BILIRUBIN TOTAL: 0.6 mg/dL (ref 0.2–1.0)
Bilirubin, Direct: 0.1 mg/dL (ref 0.00–0.20)
SGOT(AST): 25 U/L (ref 15–37)
SGPT (ALT): 23 U/L (ref 12–78)
TOTAL PROTEIN: 6.7 g/dL (ref 6.4–8.2)

## 2014-01-27 LAB — CALCIUM: Calcium, Total: 7.5 mg/dL — ABNORMAL LOW (ref 8.5–10.1)

## 2014-02-03 LAB — CBC CANCER CENTER
Basophil #: 0.1 x10 3/mm (ref 0.0–0.1)
Basophil %: 0.8 %
EOS PCT: 0.2 %
Eosinophil #: 0 x10 3/mm (ref 0.0–0.7)
HCT: 40.1 % (ref 35.0–47.0)
HGB: 13.8 g/dL (ref 12.0–16.0)
LYMPHS ABS: 1.3 x10 3/mm (ref 1.0–3.6)
Lymphocyte %: 12.1 %
MCH: 36.5 pg — AB (ref 26.0–34.0)
MCHC: 34.4 g/dL (ref 32.0–36.0)
MCV: 106 fL — ABNORMAL HIGH (ref 80–100)
MONO ABS: 0.3 x10 3/mm (ref 0.2–0.9)
Monocyte %: 2.9 %
Neutrophil #: 9 x10 3/mm — ABNORMAL HIGH (ref 1.4–6.5)
Neutrophil %: 84 %
PLATELETS: 273 x10 3/mm (ref 150–440)
RBC: 3.78 10*6/uL — ABNORMAL LOW (ref 3.80–5.20)
RDW: 18.2 % — ABNORMAL HIGH (ref 11.5–14.5)
WBC: 10.7 x10 3/mm (ref 3.6–11.0)

## 2014-02-03 LAB — HEPATIC FUNCTION PANEL A (ARMC)
ALBUMIN: 3.5 g/dL (ref 3.4–5.0)
ALK PHOS: 172 U/L — AB
ALT: 20 U/L (ref 12–78)
AST: 22 U/L (ref 15–37)
BILIRUBIN DIRECT: 0.2 mg/dL (ref 0.00–0.20)
BILIRUBIN TOTAL: 0.8 mg/dL (ref 0.2–1.0)
Total Protein: 6.5 g/dL (ref 6.4–8.2)

## 2014-02-03 LAB — CREATININE, SERUM
Creatinine: 0.87 mg/dL (ref 0.60–1.30)
EGFR (African American): >60
EGFR (Non-African Amer.): >60

## 2014-02-12 LAB — CBC CANCER CENTER
Basophil #: 0 x10 3/mm (ref 0.0–0.1)
Basophil %: 0.4 %
Eosinophil #: 0 x10 3/mm (ref 0.0–0.7)
Eosinophil %: 0.4 %
HCT: 39.9 % (ref 35.0–47.0)
HGB: 13.6 g/dL (ref 12.0–16.0)
LYMPHS ABS: 1 x10 3/mm (ref 1.0–3.6)
LYMPHS PCT: 9.9 %
MCH: 37.5 pg — ABNORMAL HIGH (ref 26.0–34.0)
MCHC: 34 g/dL (ref 32.0–36.0)
MCV: 111 fL — ABNORMAL HIGH (ref 80–100)
MONO ABS: 0.3 x10 3/mm (ref 0.2–0.9)
Monocyte %: 3.1 %
NEUTROS PCT: 86.2 %
Neutrophil #: 8.3 x10 3/mm — ABNORMAL HIGH (ref 1.4–6.5)
Platelet: 257 x10 3/mm (ref 150–440)
RBC: 3.61 10*6/uL — ABNORMAL LOW (ref 3.80–5.20)
RDW: 19.2 % — AB (ref 11.5–14.5)
WBC: 9.6 x10 3/mm (ref 3.6–11.0)

## 2014-02-12 LAB — URINALYSIS, COMPLETE
Bilirubin,UR: NEGATIVE
Blood: NEGATIVE
Glucose,UR: NEGATIVE mg/dL (ref 0–75)
KETONE: NEGATIVE
Nitrite: NEGATIVE
Ph: 7 (ref 4.5–8.0)
Protein: 30
RBC,UR: NONE SEEN /HPF (ref 0–5)
Specific Gravity: 1.008 (ref 1.003–1.030)
Squamous Epithelial: 1
WBC UR: 13 /HPF (ref 0–5)

## 2014-02-12 LAB — COMPREHENSIVE METABOLIC PANEL
Albumin: 3.5 g/dL (ref 3.4–5.0)
Alkaline Phosphatase: 178 U/L — ABNORMAL HIGH
Anion Gap: 5 — ABNORMAL LOW (ref 7–16)
BUN: 20 mg/dL — ABNORMAL HIGH (ref 7–18)
Bilirubin,Total: 1 mg/dL (ref 0.2–1.0)
CALCIUM: 8.4 mg/dL — AB (ref 8.5–10.1)
Chloride: 98 mmol/L (ref 98–107)
Co2: 27 mmol/L (ref 21–32)
Creatinine: 0.83 mg/dL (ref 0.60–1.30)
EGFR (Non-African Amer.): >60
Glucose: 113 mg/dL — ABNORMAL HIGH (ref 65–99)
Osmolality: 264 (ref 275–301)
Potassium: 4.9 mmol/L (ref 3.5–5.1)
SGOT(AST): 19 U/L (ref 15–37)
SGPT (ALT): 19 U/L (ref 12–78)
Sodium: 130 mmol/L — ABNORMAL LOW (ref 136–145)
TOTAL PROTEIN: 6.3 g/dL — AB (ref 6.4–8.2)

## 2014-02-12 LAB — PHOSPHORUS: Phosphorus: 2.7 mg/dL (ref 2.5–4.9)

## 2014-02-12 LAB — MAGNESIUM: Magnesium: 2 mg/dL

## 2014-02-15 LAB — URINE CULTURE

## 2014-02-17 ENCOUNTER — Ambulatory Visit: Payer: Self-pay | Admitting: Internal Medicine

## 2014-02-19 LAB — CBC CANCER CENTER
BASOS PCT: 0.1 %
Basophil #: 0 x10 3/mm (ref 0.0–0.1)
EOS PCT: 0.2 %
Eosinophil #: 0 x10 3/mm (ref 0.0–0.7)
HCT: 39 % (ref 35.0–47.0)
HGB: 13.1 g/dL (ref 12.0–16.0)
LYMPHS PCT: 9.8 %
Lymphocyte #: 0.9 x10 3/mm — ABNORMAL LOW (ref 1.0–3.6)
MCH: 37.3 pg — AB (ref 26.0–34.0)
MCHC: 33.6 g/dL (ref 32.0–36.0)
MCV: 111 fL — ABNORMAL HIGH (ref 80–100)
MONO ABS: 0.4 x10 3/mm (ref 0.2–0.9)
Monocyte %: 4.9 %
NEUTROS PCT: 85 %
Neutrophil #: 7.6 x10 3/mm — ABNORMAL HIGH (ref 1.4–6.5)
PLATELETS: 264 x10 3/mm (ref 150–440)
RBC: 3.52 10*6/uL — AB (ref 3.80–5.20)
RDW: 18.9 % — AB (ref 11.5–14.5)
WBC: 9 x10 3/mm (ref 3.6–11.0)

## 2014-02-19 LAB — URINALYSIS, COMPLETE
Bacteria: NONE SEEN
Bilirubin,UR: NEGATIVE
Blood: NEGATIVE
Glucose,UR: NEGATIVE mg/dL (ref 0–75)
KETONE: NEGATIVE
Nitrite: NEGATIVE
PH: 7 (ref 4.5–8.0)
Specific Gravity: 1.01 (ref 1.003–1.030)
Squamous Epithelial: 2
WBC UR: <1 /HPF (ref 0–5)

## 2014-02-21 LAB — URINE CULTURE

## 2014-03-03 ENCOUNTER — Ambulatory Visit: Payer: Self-pay | Admitting: Internal Medicine

## 2014-03-05 LAB — COMPREHENSIVE METABOLIC PANEL
ALK PHOS: 246 U/L — AB
ANION GAP: 6 — AB (ref 7–16)
Albumin: 3.4 g/dL (ref 3.4–5.0)
BILIRUBIN TOTAL: 0.9 mg/dL (ref 0.2–1.0)
BUN: 20 mg/dL — ABNORMAL HIGH (ref 7–18)
CALCIUM: 8.6 mg/dL (ref 8.5–10.1)
CHLORIDE: 96 mmol/L — AB (ref 98–107)
CREATININE: 1 mg/dL (ref 0.60–1.30)
Co2: 30 mmol/L (ref 21–32)
EGFR (African American): >60
EGFR (Non-African Amer.): 55 — ABNORMAL LOW
Glucose: 113 mg/dL — ABNORMAL HIGH (ref 65–99)
Osmolality: 268 (ref 275–301)
POTASSIUM: 4.8 mmol/L (ref 3.5–5.1)
SGOT(AST): 30 U/L (ref 15–37)
SGPT (ALT): 35 U/L (ref 12–78)
Sodium: 132 mmol/L — ABNORMAL LOW (ref 136–145)
TOTAL PROTEIN: 6.5 g/dL (ref 6.4–8.2)

## 2014-03-05 LAB — URINALYSIS, COMPLETE
Bilirubin,UR: NEGATIVE
Blood: NEGATIVE
Glucose,UR: NEGATIVE mg/dL (ref 0–75)
Ketone: NEGATIVE
NITRITE: POSITIVE
Ph: 7 (ref 4.5–8.0)
Specific Gravity: 1.009 (ref 1.003–1.030)
Squamous Epithelial: 1

## 2014-03-05 LAB — CBC CANCER CENTER
BASOS ABS: 0 x10 3/mm (ref 0.0–0.1)
Basophil %: 0.4 %
Eosinophil #: 0 x10 3/mm (ref 0.0–0.7)
Eosinophil %: 0.2 %
HCT: 40.7 % (ref 35.0–47.0)
HGB: 13.9 g/dL (ref 12.0–16.0)
LYMPHS ABS: 1 x10 3/mm (ref 1.0–3.6)
Lymphocyte %: 9.5 %
MCH: 38 pg — ABNORMAL HIGH (ref 26.0–34.0)
MCHC: 34.2 g/dL (ref 32.0–36.0)
MCV: 111 fL — ABNORMAL HIGH (ref 80–100)
MONO ABS: 0.4 x10 3/mm (ref 0.2–0.9)
Monocyte %: 3.5 %
Neutrophil #: 9 x10 3/mm — ABNORMAL HIGH (ref 1.4–6.5)
Neutrophil %: 86.4 %
PLATELETS: 203 x10 3/mm (ref 150–440)
RBC: 3.66 10*6/uL — ABNORMAL LOW (ref 3.80–5.20)
RDW: 18.1 % — ABNORMAL HIGH (ref 11.5–14.5)
WBC: 10.4 x10 3/mm (ref 3.6–11.0)

## 2014-03-07 LAB — URINE CULTURE

## 2014-03-19 ENCOUNTER — Ambulatory Visit: Payer: Self-pay | Admitting: Internal Medicine

## 2014-03-19 ENCOUNTER — Other Ambulatory Visit (HOSPITAL_COMMUNITY): Payer: Self-pay | Admitting: Internal Medicine

## 2014-04-01 LAB — COMPREHENSIVE METABOLIC PANEL
ALBUMIN: 3 g/dL — AB (ref 3.4–5.0)
ALK PHOS: 262 U/L — AB
Anion Gap: 7 (ref 7–16)
BUN: 15 mg/dL (ref 7–18)
Bilirubin,Total: 0.7 mg/dL (ref 0.2–1.0)
CHLORIDE: 100 mmol/L (ref 98–107)
Calcium, Total: 7.9 mg/dL — ABNORMAL LOW (ref 8.5–10.1)
Co2: 29 mmol/L (ref 21–32)
Creatinine: 0.81 mg/dL (ref 0.60–1.30)
EGFR (African American): >60
Glucose: 87 mg/dL (ref 65–99)
Osmolality: 272 (ref 275–301)
Potassium: 3.6 mmol/L (ref 3.5–5.1)
SGOT(AST): 39 U/L — ABNORMAL HIGH (ref 15–37)
SGPT (ALT): 34 U/L (ref 12–78)
SODIUM: 136 mmol/L (ref 136–145)
Total Protein: 5.9 g/dL — ABNORMAL LOW (ref 6.4–8.2)

## 2014-04-01 LAB — CBC CANCER CENTER
Basophil #: 0.1 x10 3/mm (ref 0.0–0.1)
Basophil %: 1.4 %
EOS PCT: 1.7 %
Eosinophil #: 0.1 x10 3/mm (ref 0.0–0.7)
HCT: 39.5 % (ref 35.0–47.0)
HGB: 13.8 g/dL (ref 12.0–16.0)
LYMPHS PCT: 22.6 %
Lymphocyte #: 1.9 x10 3/mm (ref 1.0–3.6)
MCH: 39.2 pg — ABNORMAL HIGH (ref 26.0–34.0)
MCHC: 35.1 g/dL (ref 32.0–36.0)
MCV: 112 fL — ABNORMAL HIGH (ref 80–100)
MONO ABS: 0.8 x10 3/mm (ref 0.2–0.9)
Monocyte %: 8.8 %
NEUTROS ABS: 5.6 x10 3/mm (ref 1.4–6.5)
NEUTROS PCT: 65.5 %
Platelet: 251 x10 3/mm (ref 150–440)
RBC: 3.53 10*6/uL — ABNORMAL LOW (ref 3.80–5.20)
RDW: 16 % — ABNORMAL HIGH (ref 11.5–14.5)
WBC: 8.6 x10 3/mm (ref 3.6–11.0)

## 2014-04-07 LAB — CBC CANCER CENTER
BASOS PCT: 1 %
Basophil #: 0.1 x10 3/mm (ref 0.0–0.1)
EOS ABS: 0.1 x10 3/mm (ref 0.0–0.7)
EOS PCT: 0.5 %
HCT: 41.6 % (ref 35.0–47.0)
HGB: 14.5 g/dL (ref 12.0–16.0)
LYMPHS PCT: 8 %
Lymphocyte #: 0.9 x10 3/mm — ABNORMAL LOW (ref 1.0–3.6)
MCH: 39.2 pg — AB (ref 26.0–34.0)
MCHC: 34.7 g/dL (ref 32.0–36.0)
MCV: 113 fL — AB (ref 80–100)
MONOS PCT: 4.7 %
Monocyte #: 0.5 x10 3/mm (ref 0.2–0.9)
NEUTROS ABS: 9.3 x10 3/mm — AB (ref 1.4–6.5)
Neutrophil %: 85.8 %
PLATELETS: 259 x10 3/mm (ref 150–440)
RBC: 3.69 10*6/uL — AB (ref 3.80–5.20)
RDW: 16.3 % — AB (ref 11.5–14.5)
WBC: 10.8 x10 3/mm (ref 3.6–11.0)

## 2014-04-07 LAB — HEPATIC FUNCTION PANEL A (ARMC)
ALK PHOS: 283 U/L — AB
AST: 46 U/L — AB (ref 15–37)
Albumin: 3.1 g/dL — ABNORMAL LOW (ref 3.4–5.0)
Bilirubin, Direct: 0.1 mg/dL (ref 0.00–0.20)
Bilirubin,Total: 0.7 mg/dL (ref 0.2–1.0)
SGPT (ALT): 36 U/L (ref 12–78)
Total Protein: 6.4 g/dL (ref 6.4–8.2)

## 2014-04-19 ENCOUNTER — Ambulatory Visit: Payer: Self-pay | Admitting: Internal Medicine

## 2014-04-30 LAB — CBC CANCER CENTER
Basophil #: 0.1 10*3/uL
Basophil %: 0.6 %
Eosinophil #: 0 10*3/uL
Eosinophil %: 0.1 %
HCT: 37.2 %
HGB: 12.7 g/dL
Lymphocyte %: 10.8 %
Lymphs Abs: 0.9 10*3/uL — ABNORMAL LOW
MCH: 39.2 pg — ABNORMAL HIGH
MCHC: 34.3 g/dL
MCV: 114 fL — ABNORMAL HIGH
Monocyte #: 0.1 10*3/uL — ABNORMAL LOW
Monocyte %: 1.6 %
Neutrophil #: 7.5 10*3/uL — ABNORMAL HIGH
Neutrophil %: 86.9 %
Platelet: 239 10*3/uL
RBC: 3.25 10*6/uL — ABNORMAL LOW
RDW: 16.2 % — ABNORMAL HIGH
WBC: 8.7 10*3/uL

## 2014-04-30 LAB — HEPATIC FUNCTION PANEL A (ARMC)
ALBUMIN: 2.9 g/dL — AB (ref 3.4–5.0)
Alkaline Phosphatase: 331 U/L — ABNORMAL HIGH
BILIRUBIN DIRECT: 0.1 mg/dL (ref 0.00–0.20)
Bilirubin,Total: 0.6 mg/dL (ref 0.2–1.0)
SGOT(AST): 52 U/L — ABNORMAL HIGH (ref 15–37)
SGPT (ALT): 34 U/L
TOTAL PROTEIN: 5.8 g/dL — AB (ref 6.4–8.2)

## 2014-04-30 LAB — CREATININE, SERUM
CREATININE: 1.1 mg/dL (ref 0.60–1.30)
EGFR (African American): 57 — ABNORMAL LOW
GFR CALC NON AF AMER: 49 — AB

## 2014-04-30 LAB — CALCIUM: Calcium, Total: 8.3 mg/dL — ABNORMAL LOW

## 2014-05-05 LAB — CBC CANCER CENTER
BASOS PCT: 0.5 %
Basophil #: 0.1 x10 3/mm (ref 0.0–0.1)
EOS ABS: 0 x10 3/mm (ref 0.0–0.7)
EOS PCT: 0.1 %
HCT: 39.8 % (ref 35.0–47.0)
HGB: 13.4 g/dL (ref 12.0–16.0)
Lymphocyte #: 1.3 x10 3/mm (ref 1.0–3.6)
Lymphocyte %: 10.8 %
MCH: 38.9 pg — ABNORMAL HIGH (ref 26.0–34.0)
MCHC: 33.7 g/dL (ref 32.0–36.0)
MCV: 115 fL — ABNORMAL HIGH (ref 80–100)
MONOS PCT: 5.8 %
Monocyte #: 0.7 x10 3/mm (ref 0.2–0.9)
NEUTROS PCT: 82.8 %
Neutrophil #: 9.9 x10 3/mm — ABNORMAL HIGH (ref 1.4–6.5)
Platelet: 241 x10 3/mm (ref 150–440)
RBC: 3.45 10*6/uL — ABNORMAL LOW (ref 3.80–5.20)
RDW: 16.4 % — ABNORMAL HIGH (ref 11.5–14.5)
WBC: 12 x10 3/mm — AB (ref 3.6–11.0)

## 2014-05-05 LAB — HEPATIC FUNCTION PANEL A (ARMC)
ALBUMIN: 3.1 g/dL — AB (ref 3.4–5.0)
Alkaline Phosphatase: 361 U/L — ABNORMAL HIGH
BILIRUBIN DIRECT: 0.1 mg/dL (ref 0.00–0.20)
Bilirubin,Total: 0.7 mg/dL (ref 0.2–1.0)
SGOT(AST): 52 U/L — ABNORMAL HIGH (ref 15–37)
SGPT (ALT): 43 U/L
Total Protein: 6.1 g/dL — ABNORMAL LOW (ref 6.4–8.2)

## 2014-05-05 LAB — CALCIUM: Calcium, Total: 7.7 mg/dL — ABNORMAL LOW (ref 8.5–10.1)

## 2014-05-05 LAB — CREATININE, SERUM
CREATININE: 1.02 mg/dL (ref 0.60–1.30)
EGFR (African American): >60
GFR CALC NON AF AMER: 54 — AB

## 2014-05-08 ENCOUNTER — Ambulatory Visit: Payer: Self-pay | Admitting: Internal Medicine

## 2014-05-14 LAB — CBC CANCER CENTER
Basophil #: 0.1 x10 3/mm (ref 0.0–0.1)
Basophil %: 0.5 %
EOS PCT: 0.1 %
Eosinophil #: 0 x10 3/mm (ref 0.0–0.7)
HCT: 38.8 % (ref 35.0–47.0)
HGB: 13 g/dL (ref 12.0–16.0)
LYMPHS PCT: 9.9 %
Lymphocyte #: 1.2 x10 3/mm (ref 1.0–3.6)
MCH: 38.7 pg — AB (ref 26.0–34.0)
MCHC: 33.6 g/dL (ref 32.0–36.0)
MCV: 115 fL — AB (ref 80–100)
MONO ABS: 0.5 x10 3/mm (ref 0.2–0.9)
Monocyte %: 4.2 %
NEUTROS ABS: 10.6 x10 3/mm — AB (ref 1.4–6.5)
Neutrophil %: 85.3 %
Platelet: 203 x10 3/mm (ref 150–440)
RBC: 3.37 10*6/uL — ABNORMAL LOW (ref 3.80–5.20)
RDW: 16 % — ABNORMAL HIGH (ref 11.5–14.5)
WBC: 12.4 x10 3/mm — AB (ref 3.6–11.0)

## 2014-05-14 LAB — BILIRUBIN, TOTAL: BILIRUBIN TOTAL: 0.5 mg/dL (ref 0.2–1.0)

## 2014-05-14 LAB — SGOT (AST)(ARMC): AST: 55 U/L — AB (ref 15–37)

## 2014-05-20 ENCOUNTER — Ambulatory Visit: Payer: Self-pay | Admitting: Internal Medicine

## 2014-05-22 LAB — CBC CANCER CENTER
BASOS ABS: 0 x10 3/mm (ref 0.0–0.1)
Basophil %: 0.8 %
EOS ABS: 0 x10 3/mm (ref 0.0–0.7)
EOS PCT: 0.1 %
HCT: 35.2 % (ref 35.0–47.0)
HGB: 12 g/dL (ref 12.0–16.0)
LYMPHS PCT: 19.4 %
Lymphocyte #: 0.9 x10 3/mm — ABNORMAL LOW (ref 1.0–3.6)
MCH: 38.9 pg — ABNORMAL HIGH (ref 26.0–34.0)
MCHC: 34 g/dL (ref 32.0–36.0)
MCV: 114 fL — ABNORMAL HIGH (ref 80–100)
Monocyte #: 0.3 x10 3/mm (ref 0.2–0.9)
Monocyte %: 6.2 %
NEUTROS ABS: 3.5 x10 3/mm (ref 1.4–6.5)
Neutrophil %: 73.5 %
Platelet: 227 x10 3/mm (ref 150–440)
RBC: 3.08 10*6/uL — ABNORMAL LOW (ref 3.80–5.20)
RDW: 16 % — AB (ref 11.5–14.5)
WBC: 4.7 x10 3/mm (ref 3.6–11.0)

## 2014-05-22 LAB — CREATININE, SERUM: CREATININE: 0.93 mg/dL (ref 0.60–1.30)

## 2014-05-22 LAB — MAGNESIUM: Magnesium: 1.8 mg/dL

## 2014-05-22 LAB — POTASSIUM: POTASSIUM: 3.8 mmol/L (ref 3.5–5.1)

## 2014-05-29 LAB — CBC CANCER CENTER
BASOS ABS: 0.1 x10 3/mm (ref 0.0–0.1)
Basophil %: 1.1 %
Eosinophil #: 0 x10 3/mm (ref 0.0–0.7)
Eosinophil %: 0 %
HCT: 36.8 % (ref 35.0–47.0)
HGB: 12.5 g/dL (ref 12.0–16.0)
LYMPHS ABS: 1 x10 3/mm (ref 1.0–3.6)
LYMPHS PCT: 18.5 %
MCH: 38.5 pg — ABNORMAL HIGH (ref 26.0–34.0)
MCHC: 34.1 g/dL (ref 32.0–36.0)
MCV: 113 fL — AB (ref 80–100)
Monocyte #: 0.3 x10 3/mm (ref 0.2–0.9)
Monocyte %: 6.5 %
Neutrophil #: 3.8 x10 3/mm (ref 1.4–6.5)
Neutrophil %: 73.9 %
PLATELETS: 228 x10 3/mm (ref 150–440)
RBC: 3.26 10*6/uL — AB (ref 3.80–5.20)
RDW: 15.7 % — ABNORMAL HIGH (ref 11.5–14.5)
WBC: 5.2 x10 3/mm (ref 3.6–11.0)

## 2014-06-05 LAB — CBC CANCER CENTER
BASOS PCT: 3 %
Basophil #: 0.3 x10 3/mm — ABNORMAL HIGH (ref 0.0–0.1)
EOS ABS: 0 x10 3/mm (ref 0.0–0.7)
EOS PCT: 0.1 %
HCT: 38.6 % (ref 35.0–47.0)
HGB: 13 g/dL (ref 12.0–16.0)
LYMPHS PCT: 9 %
Lymphocyte #: 0.9 x10 3/mm — ABNORMAL LOW (ref 1.0–3.6)
MCH: 38.5 pg — ABNORMAL HIGH (ref 26.0–34.0)
MCHC: 33.7 g/dL (ref 32.0–36.0)
MCV: 114 fL — ABNORMAL HIGH (ref 80–100)
MONO ABS: 0.5 x10 3/mm (ref 0.2–0.9)
MONOS PCT: 4.8 %
NEUTROS PCT: 83.1 %
Neutrophil #: 8.1 x10 3/mm — ABNORMAL HIGH (ref 1.4–6.5)
Platelet: 252 x10 3/mm (ref 150–440)
RBC: 3.37 10*6/uL — ABNORMAL LOW (ref 3.80–5.20)
RDW: 16.1 % — AB (ref 11.5–14.5)
WBC: 9.8 x10 3/mm (ref 3.6–11.0)

## 2014-06-05 LAB — COMPREHENSIVE METABOLIC PANEL
ALBUMIN: 2.8 g/dL — AB (ref 3.4–5.0)
AST: 37 U/L (ref 15–37)
Alkaline Phosphatase: 342 U/L — ABNORMAL HIGH
Anion Gap: 8 (ref 7–16)
BUN: 14 mg/dL (ref 7–18)
Bilirubin,Total: 0.5 mg/dL (ref 0.2–1.0)
Calcium, Total: 7.9 mg/dL — ABNORMAL LOW (ref 8.5–10.1)
Chloride: 98 mmol/L (ref 98–107)
Co2: 26 mmol/L (ref 21–32)
Creatinine: 0.92 mg/dL (ref 0.60–1.30)
EGFR (African American): >60
EGFR (Non-African Amer.): >60
GLUCOSE: 162 mg/dL — AB (ref 65–99)
Osmolality: 269 (ref 275–301)
Potassium: 3.7 mmol/L (ref 3.5–5.1)
SGPT (ALT): 38 U/L
SODIUM: 132 mmol/L — AB (ref 136–145)
Total Protein: 5.6 g/dL — ABNORMAL LOW (ref 6.4–8.2)

## 2014-06-16 LAB — CBC CANCER CENTER
BASOS ABS: 0.1 x10 3/mm (ref 0.0–0.1)
BASOS PCT: 0.5 %
EOS PCT: 0.2 %
Eosinophil #: 0 x10 3/mm (ref 0.0–0.7)
HCT: 37.8 % (ref 35.0–47.0)
HGB: 12.7 g/dL (ref 12.0–16.0)
LYMPHS ABS: 0.9 x10 3/mm — AB (ref 1.0–3.6)
LYMPHS PCT: 8.2 %
MCH: 37.7 pg — ABNORMAL HIGH (ref 26.0–34.0)
MCHC: 33.4 g/dL (ref 32.0–36.0)
MCV: 113 fL — AB (ref 80–100)
MONO ABS: 0.6 x10 3/mm (ref 0.2–0.9)
Monocyte %: 4.8 %
NEUTROS PCT: 86.3 %
Neutrophil #: 9.9 x10 3/mm — ABNORMAL HIGH (ref 1.4–6.5)
Platelet: 202 x10 3/mm (ref 150–440)
RBC: 3.36 10*6/uL — ABNORMAL LOW (ref 3.80–5.20)
RDW: 15.5 % — ABNORMAL HIGH (ref 11.5–14.5)
WBC: 11.5 x10 3/mm — ABNORMAL HIGH (ref 3.6–11.0)

## 2014-06-16 LAB — CREATININE, SERUM
CREATININE: 0.67 mg/dL (ref 0.60–1.30)
EGFR (African American): >60
EGFR (Non-African Amer.): >60

## 2014-06-19 ENCOUNTER — Ambulatory Visit: Payer: Self-pay | Admitting: Internal Medicine

## 2014-06-24 LAB — CBC CANCER CENTER
BASOS PCT: 2.3 %
Basophil #: 0.1 x10 3/mm (ref 0.0–0.1)
EOS ABS: 0 x10 3/mm (ref 0.0–0.7)
Eosinophil %: 1.3 %
HCT: 35.4 % (ref 35.0–47.0)
HGB: 12.3 g/dL (ref 12.0–16.0)
LYMPHS ABS: 1.2 x10 3/mm (ref 1.0–3.6)
Lymphocyte %: 36 %
MCH: 38.4 pg — ABNORMAL HIGH (ref 26.0–34.0)
MCHC: 34.6 g/dL (ref 32.0–36.0)
MCV: 111 fL — ABNORMAL HIGH (ref 80–100)
MONO ABS: 0.5 x10 3/mm (ref 0.2–0.9)
MONOS PCT: 15.4 %
NEUTROS PCT: 45 %
Neutrophil #: 1.5 x10 3/mm (ref 1.4–6.5)
Platelet: 259 x10 3/mm (ref 150–440)
RBC: 3.19 10*6/uL — AB (ref 3.80–5.20)
RDW: 14.7 % — ABNORMAL HIGH (ref 11.5–14.5)
WBC: 3.4 x10 3/mm — ABNORMAL LOW (ref 3.6–11.0)

## 2014-06-24 LAB — CALCIUM: CALCIUM: 8.1 mg/dL — AB (ref 8.5–10.1)

## 2014-06-24 LAB — PHOSPHORUS: Phosphorus: 2.1 mg/dL — ABNORMAL LOW (ref 2.5–4.9)

## 2014-06-26 LAB — CBC CANCER CENTER
Basophil #: 0 x10 3/mm (ref 0.0–0.1)
Basophil %: 1 %
EOS PCT: 0.4 %
Eosinophil #: 0 x10 3/mm (ref 0.0–0.7)
HCT: 36.6 % (ref 35.0–47.0)
HGB: 12.8 g/dL (ref 12.0–16.0)
LYMPHS ABS: 0.8 x10 3/mm — AB (ref 1.0–3.6)
Lymphocyte %: 38.4 %
MCH: 38.3 pg — ABNORMAL HIGH (ref 26.0–34.0)
MCHC: 35 g/dL (ref 32.0–36.0)
MCV: 110 fL — AB (ref 80–100)
MONOS PCT: 9.2 %
Monocyte #: 0.2 x10 3/mm (ref 0.2–0.9)
NEUTROS PCT: 51 %
Neutrophil #: 1.1 x10 3/mm — ABNORMAL LOW (ref 1.4–6.5)
Platelet: 255 x10 3/mm (ref 150–440)
RBC: 3.34 10*6/uL — AB (ref 3.80–5.20)
RDW: 15 % — ABNORMAL HIGH (ref 11.5–14.5)
WBC: 2.1 x10 3/mm — ABNORMAL LOW (ref 3.6–11.0)

## 2014-06-27 LAB — CBC CANCER CENTER
Basophil #: 0 x10 3/mm (ref 0.0–0.1)
Basophil %: 0.2 %
Eosinophil #: 0 x10 3/mm (ref 0.0–0.7)
Eosinophil %: 0.4 %
HCT: 36.6 % (ref 35.0–47.0)
HGB: 12.6 g/dL (ref 12.0–16.0)
Lymphocyte %: 44.9 %
Lymphs Abs: 0.9 x10 3/mm — ABNORMAL LOW (ref 1.0–3.6)
MCH: 38 pg — ABNORMAL HIGH (ref 26.0–34.0)
MCHC: 34.4 g/dL (ref 32.0–36.0)
MCV: 111 fL — ABNORMAL HIGH (ref 80–100)
Monocyte #: 0.1 x10 3/mm — ABNORMAL LOW (ref 0.2–0.9)
Monocyte %: 4.3 %
Neutrophil #: 1 x10 3/mm — ABNORMAL LOW (ref 1.4–6.5)
Neutrophil %: 50.2 %
Platelet: 218 x10 3/mm (ref 150–440)
RBC: 3.31 x10 6/mm — ABNORMAL LOW (ref 3.80–5.20)
RDW: 14.9 % — ABNORMAL HIGH (ref 11.5–14.5)
WBC: 2 x10 3/mm — CL (ref 3.6–11.0)

## 2014-06-30 LAB — COMPREHENSIVE METABOLIC PANEL
ALBUMIN: 2.5 g/dL — AB (ref 3.4–5.0)
ALK PHOS: 249 U/L — AB
ANION GAP: 10 (ref 7–16)
AST: 30 U/L (ref 15–37)
BILIRUBIN TOTAL: 0.9 mg/dL (ref 0.2–1.0)
BUN: 8 mg/dL (ref 7–18)
CALCIUM: 7.1 mg/dL — AB (ref 8.5–10.1)
CO2: 25 mmol/L (ref 21–32)
Chloride: 100 mmol/L (ref 98–107)
Creatinine: 0.84 mg/dL (ref 0.60–1.30)
EGFR (African American): >60
EGFR (Non-African Amer.): >60
Glucose: 87 mg/dL (ref 65–99)
Osmolality: 268 (ref 275–301)
Potassium: 3.1 mmol/L — ABNORMAL LOW (ref 3.5–5.1)
SGPT (ALT): 23 U/L
Sodium: 135 mmol/L — ABNORMAL LOW (ref 136–145)
Total Protein: 5.1 g/dL — ABNORMAL LOW (ref 6.4–8.2)

## 2014-06-30 LAB — CBC CANCER CENTER
BASOS ABS: 3 %
Bands: 12 %
EOS PCT: 1 %
HCT: 34.3 % — ABNORMAL LOW (ref 35.0–47.0)
HGB: 12 g/dL (ref 12.0–16.0)
Lymphocytes: 17 %
MCH: 38.2 pg — AB (ref 26.0–34.0)
MCHC: 34.9 g/dL (ref 32.0–36.0)
MCV: 110 fL — AB (ref 80–100)
METAMYELOCYTE: 6 %
Monocytes: 15 %
Myelocyte: 2 %
Platelet: 164 x10 3/mm (ref 150–440)
RBC: 3.13 10*6/uL — AB (ref 3.80–5.20)
RDW: 14.8 % — AB (ref 11.5–14.5)
SEGMENTED NEUTROPHILS: 35 %
Variant Lymphocyte: 9 %
WBC: 6.9 x10 3/mm (ref 3.6–11.0)

## 2014-06-30 LAB — MAGNESIUM: Magnesium: 1.4 mg/dL — ABNORMAL LOW

## 2014-07-01 ENCOUNTER — Other Ambulatory Visit (HOSPITAL_COMMUNITY): Payer: Self-pay | Admitting: Internal Medicine

## 2014-07-03 LAB — CBC CANCER CENTER
Bands: 15 %
HCT: 33.8 % — ABNORMAL LOW (ref 35.0–47.0)
HGB: 11.4 g/dL — ABNORMAL LOW (ref 12.0–16.0)
Lymphocytes: 6 %
MCH: 36.8 pg — ABNORMAL HIGH (ref 26.0–34.0)
MCHC: 33.9 g/dL (ref 32.0–36.0)
MCV: 109 fL — ABNORMAL HIGH (ref 80–100)
METAMYELOCYTE: 2 %
Monocytes: 10 %
Platelet: 184 x10 3/mm (ref 150–440)
RBC: 3.11 10*6/uL — AB (ref 3.80–5.20)
RDW: 15 % — ABNORMAL HIGH (ref 11.5–14.5)
SEGMENTED NEUTROPHILS: 65 %
Variant Lymphocyte: 2 %
WBC: 42.6 x10 3/mm — ABNORMAL HIGH (ref 3.6–11.0)

## 2014-07-03 LAB — MAGNESIUM: Magnesium: 1.5 mg/dL — ABNORMAL LOW

## 2014-07-03 LAB — POTASSIUM: Potassium: 3.5 mmol/L (ref 3.5–5.1)

## 2014-07-07 ENCOUNTER — Inpatient Hospital Stay: Payer: Self-pay | Admitting: Internal Medicine

## 2014-07-07 LAB — COMPREHENSIVE METABOLIC PANEL
ALBUMIN: 1.7 g/dL — AB (ref 3.4–5.0)
ALT: 15 U/L
ANION GAP: 12 (ref 7–16)
ANION GAP: 10 (ref 7–16)
Albumin: 2 g/dL — ABNORMAL LOW (ref 3.4–5.0)
Alkaline Phosphatase: 175 U/L — ABNORMAL HIGH
Alkaline Phosphatase: 156 U/L — ABNORMAL HIGH
BUN: 19 mg/dL — ABNORMAL HIGH (ref 7–18)
BUN: 19 mg/dL — ABNORMAL HIGH (ref 7–18)
Bilirubin,Total: 0.8 mg/dL (ref 0.2–1.0)
Bilirubin,Total: 0.7 mg/dL (ref 0.2–1.0)
CALCIUM: 6.2 mg/dL — AB (ref 8.5–10.1)
CHLORIDE: 91 mmol/L — AB (ref 98–107)
CO2: 22 mmol/L (ref 21–32)
Calcium, Total: 7.1 mg/dL — ABNORMAL LOW (ref 8.5–10.1)
Chloride: 93 mmol/L — ABNORMAL LOW (ref 98–107)
Co2: 21 mmol/L (ref 21–32)
Creatinine: 1.27 mg/dL (ref 0.60–1.30)
Creatinine: 1.12 mg/dL (ref 0.60–1.30)
EGFR (African American): >60
EGFR (Non-African Amer.): 44 — ABNORMAL LOW
GFR CALC AF AMER: 53 — AB
GFR CALC NON AF AMER: 50 — AB
GLUCOSE: 84 mg/dL (ref 65–99)
GLUCOSE: 93 mg/dL (ref 65–99)
OSMOLALITY: 251 (ref 275–301)
OSMOLALITY: 253 (ref 275–301)
Potassium: 3.8 mmol/L (ref 3.5–5.1)
Potassium: 3.5 mmol/L (ref 3.5–5.1)
SGOT(AST): 17 U/L (ref 15–37)
SGOT(AST): 13 U/L — ABNORMAL LOW (ref 15–37)
SGPT (ALT): 18 U/L
SODIUM: 124 mmol/L — AB (ref 136–145)
Sodium: 125 mmol/L — ABNORMAL LOW (ref 136–145)
Total Protein: 5.4 g/dL — ABNORMAL LOW (ref 6.4–8.2)
Total Protein: 4.9 g/dL — ABNORMAL LOW (ref 6.4–8.2)

## 2014-07-07 LAB — CBC
HCT: 33.8 % — ABNORMAL LOW (ref 35.0–47.0)
HGB: 11.5 g/dL — ABNORMAL LOW (ref 12.0–16.0)
MCH: 36.8 pg — ABNORMAL HIGH (ref 26.0–34.0)
MCHC: 34 g/dL (ref 32.0–36.0)
MCV: 108 fL — AB (ref 80–100)
Platelet: 158 10*3/uL (ref 150–440)
RBC: 3.12 10*6/uL — AB (ref 3.80–5.20)
RDW: 15.2 % — ABNORMAL HIGH (ref 11.5–14.5)
WBC: 49.8 10*3/uL — ABNORMAL HIGH (ref 3.6–11.0)

## 2014-07-07 LAB — CBC CANCER CENTER
BASOS ABS: 0.1 x10 3/mm (ref 0.0–0.1)
Basophil %: 0.2 %
EOS ABS: 0 x10 3/mm (ref 0.0–0.7)
Eosinophil %: 0.1 %
HCT: 36.8 % (ref 35.0–47.0)
HGB: 12.3 g/dL (ref 12.0–16.0)
Lymphocyte #: 0.3 x10 3/mm — ABNORMAL LOW (ref 1.0–3.6)
Lymphocyte %: 0.6 %
MCH: 36.6 pg — AB (ref 26.0–34.0)
MCHC: 33.3 g/dL (ref 32.0–36.0)
MCV: 110 fL — ABNORMAL HIGH (ref 80–100)
Monocyte #: 0.4 x10 3/mm (ref 0.2–0.9)
Monocyte %: 0.8 %
NEUTROS PCT: 98.3 %
Neutrophil #: 49.5 x10 3/mm — ABNORMAL HIGH (ref 1.4–6.5)
PLATELETS: 158 x10 3/mm (ref 150–440)
RBC: 3.34 10*6/uL — ABNORMAL LOW (ref 3.80–5.20)
RDW: 15.2 % — ABNORMAL HIGH (ref 11.5–14.5)
WBC: 50.4 x10 3/mm — AB (ref 3.6–11.0)

## 2014-07-07 LAB — CK TOTAL AND CKMB (NOT AT ARMC)
CK, TOTAL: 63 U/L
CK-MB: 0.7 ng/mL (ref 0.5–3.6)

## 2014-07-07 LAB — APTT: Activated PTT: 44.8 secs — ABNORMAL HIGH (ref 23.6–35.9)

## 2014-07-07 LAB — TROPONIN I
Troponin-I: <0.02 ng/mL
Troponin-I: <0.02 ng/mL

## 2014-07-07 LAB — PROTIME-INR
INR: 1.4
Prothrombin Time: 16.8 secs — ABNORMAL HIGH (ref 11.5–14.7)

## 2014-07-07 LAB — MAGNESIUM: Magnesium: 1.6 mg/dL — ABNORMAL LOW

## 2014-07-08 LAB — BASIC METABOLIC PANEL
Anion Gap: 10 (ref 7–16)
BUN: 22 mg/dL — ABNORMAL HIGH (ref 7–18)
CALCIUM: 6.2 mg/dL — AB (ref 8.5–10.1)
CO2: 23 mmol/L (ref 21–32)
CREATININE: 1.18 mg/dL (ref 0.60–1.30)
Chloride: 94 mmol/L — ABNORMAL LOW (ref 98–107)
EGFR (African American): 58 — ABNORMAL LOW
EGFR (Non-African Amer.): 47 — ABNORMAL LOW
GLUCOSE: 72 mg/dL (ref 65–99)
Osmolality: 257 (ref 275–301)
Potassium: 3.6 mmol/L (ref 3.5–5.1)
SODIUM: 127 mmol/L — AB (ref 136–145)

## 2014-07-08 LAB — CBC WITH DIFFERENTIAL/PLATELET
BASOS ABS: 0.2 10*3/uL — AB (ref 0.0–0.1)
Basophil %: 0.4 %
EOS ABS: 0 10*3/uL (ref 0.0–0.7)
EOS PCT: 0 %
HCT: 29.4 % — ABNORMAL LOW (ref 35.0–47.0)
HGB: 10 g/dL — AB (ref 12.0–16.0)
Lymphocyte #: 0.4 10*3/uL — ABNORMAL LOW (ref 1.0–3.6)
Lymphocyte %: 1 %
MCH: 37.2 pg — ABNORMAL HIGH (ref 26.0–34.0)
MCHC: 34.1 g/dL (ref 32.0–36.0)
MCV: 109 fL — AB (ref 80–100)
Monocyte #: 0.2 x10 3/mm (ref 0.2–0.9)
Monocyte %: 0.4 %
Neutrophil #: 41.6 10*3/uL — ABNORMAL HIGH (ref 1.4–6.5)
Neutrophil %: 98.2 %
Platelet: 150 10*3/uL (ref 150–440)
RBC: 2.69 10*6/uL — AB (ref 3.80–5.20)
RDW: 15 % — ABNORMAL HIGH (ref 11.5–14.5)
WBC: 42.4 10*3/uL — ABNORMAL HIGH (ref 3.6–11.0)

## 2014-07-08 LAB — MAGNESIUM: Magnesium: 1.7 mg/dL — ABNORMAL LOW

## 2014-07-08 LAB — PHOSPHORUS
PHOSPHORUS: 1.8 mg/dL — AB (ref 2.5–4.9)
Phosphorus: 3.4 mg/dL (ref 2.5–4.9)

## 2014-07-09 LAB — COMPREHENSIVE METABOLIC PANEL
ALK PHOS: 95 U/L
ALT: 13 U/L — AB
Albumin: 1.2 g/dL — ABNORMAL LOW (ref 3.4–5.0)
Anion Gap: 9 (ref 7–16)
BILIRUBIN TOTAL: 0.7 mg/dL (ref 0.2–1.0)
BUN: 20 mg/dL — AB (ref 7–18)
CALCIUM: 5.9 mg/dL — AB (ref 8.5–10.1)
CO2: 22 mmol/L (ref 21–32)
Chloride: 98 mmol/L (ref 98–107)
Creatinine: 1.17 mg/dL (ref 0.60–1.30)
EGFR (African American): 58 — ABNORMAL LOW
EGFR (Non-African Amer.): 48 — ABNORMAL LOW
Glucose: 67 mg/dL (ref 65–99)
Osmolality: 260 (ref 275–301)
POTASSIUM: 3.9 mmol/L (ref 3.5–5.1)
SGOT(AST): 14 U/L — ABNORMAL LOW (ref 15–37)
Sodium: 129 mmol/L — ABNORMAL LOW (ref 136–145)
Total Protein: 4 g/dL — ABNORMAL LOW (ref 6.4–8.2)

## 2014-07-09 LAB — CBC WITH DIFFERENTIAL/PLATELET
BASOS PCT: 0.4 %
Basophil #: 0.1 10*3/uL (ref 0.0–0.1)
EOS ABS: 0 10*3/uL (ref 0.0–0.7)
Eosinophil %: 0.1 %
HCT: 28 % — ABNORMAL LOW (ref 35.0–47.0)
HGB: 9.5 g/dL — AB (ref 12.0–16.0)
Lymphocyte #: 0.3 10*3/uL — ABNORMAL LOW (ref 1.0–3.6)
Lymphocyte %: 1 %
MCH: 36.9 pg — ABNORMAL HIGH (ref 26.0–34.0)
MCHC: 34 g/dL (ref 32.0–36.0)
MCV: 108 fL — ABNORMAL HIGH (ref 80–100)
MONO ABS: 0.3 x10 3/mm (ref 0.2–0.9)
MONOS PCT: 0.9 %
Neutrophil #: 29.4 10*3/uL — ABNORMAL HIGH (ref 1.4–6.5)
Neutrophil %: 97.6 %
PLATELETS: 162 10*3/uL (ref 150–440)
RBC: 2.58 10*6/uL — ABNORMAL LOW (ref 3.80–5.20)
RDW: 15.4 % — ABNORMAL HIGH (ref 11.5–14.5)
WBC: 30.1 10*3/uL — ABNORMAL HIGH (ref 3.6–11.0)

## 2014-07-09 LAB — MAGNESIUM
Magnesium: 1.5 mg/dL — ABNORMAL LOW
Magnesium: 2.5 mg/dL — ABNORMAL HIGH

## 2014-07-10 LAB — VANCOMYCIN, TROUGH: VANCOMYCIN, TROUGH: 14 ug/mL (ref 10–20)

## 2014-07-10 LAB — MAGNESIUM: MAGNESIUM: 2.1 mg/dL

## 2014-07-11 LAB — BASIC METABOLIC PANEL
ANION GAP: 7 (ref 7–16)
BUN: 24 mg/dL — ABNORMAL HIGH (ref 7–18)
CALCIUM: 6.3 mg/dL — AB (ref 8.5–10.1)
CHLORIDE: 102 mmol/L (ref 98–107)
Co2: 23 mmol/L (ref 21–32)
Creatinine: 0.99 mg/dL (ref 0.60–1.30)
EGFR (African American): >60
EGFR (Non-African Amer.): 58 — ABNORMAL LOW
Glucose: 104 mg/dL — ABNORMAL HIGH (ref 65–99)
Osmolality: 269 (ref 275–301)
Potassium: 3.2 mmol/L — ABNORMAL LOW (ref 3.5–5.1)
SODIUM: 132 mmol/L — AB (ref 136–145)

## 2014-07-11 LAB — CBC WITH DIFFERENTIAL/PLATELET
Basophil #: 0.1 10*3/uL (ref 0.0–0.1)
Basophil %: 0.5 %
EOS ABS: 0 10*3/uL (ref 0.0–0.7)
EOS PCT: 0 %
HCT: 26.6 % — ABNORMAL LOW (ref 35.0–47.0)
HGB: 8.6 g/dL — ABNORMAL LOW (ref 12.0–16.0)
LYMPHS ABS: 0.4 10*3/uL — AB (ref 1.0–3.6)
Lymphocyte %: 1.8 %
MCH: 35.8 pg — ABNORMAL HIGH (ref 26.0–34.0)
MCHC: 32.4 g/dL (ref 32.0–36.0)
MCV: 111 fL — ABNORMAL HIGH (ref 80–100)
MONO ABS: 0.8 x10 3/mm (ref 0.2–0.9)
Monocyte %: 3.7 %
NEUTROS ABS: 20.3 10*3/uL — AB (ref 1.4–6.5)
Neutrophil %: 94 %
PLATELETS: 201 10*3/uL (ref 150–440)
RBC: 2.4 10*6/uL — AB (ref 3.80–5.20)
RDW: 15.2 % — ABNORMAL HIGH (ref 11.5–14.5)
WBC: 21.6 10*3/uL — ABNORMAL HIGH (ref 3.6–11.0)

## 2014-07-11 LAB — MAGNESIUM: Magnesium: 1.6 mg/dL — ABNORMAL LOW

## 2014-07-12 LAB — CULTURE, BLOOD (SINGLE)

## 2014-07-12 LAB — BASIC METABOLIC PANEL
Anion Gap: 10 (ref 7–16)
BUN: 24 mg/dL — ABNORMAL HIGH (ref 7–18)
CHLORIDE: 104 mmol/L (ref 98–107)
CREATININE: 0.94 mg/dL (ref 0.60–1.30)
Calcium, Total: 6.5 mg/dL — CL (ref 8.5–10.1)
Co2: 22 mmol/L (ref 21–32)
EGFR (African American): >60
EGFR (Non-African Amer.): >60
GLUCOSE: 65 mg/dL (ref 65–99)
Osmolality: 274 (ref 275–301)
POTASSIUM: 3.5 mmol/L (ref 3.5–5.1)
Sodium: 136 mmol/L (ref 136–145)

## 2014-07-12 LAB — CBC WITH DIFFERENTIAL/PLATELET
Basophil #: 0 10*3/uL (ref 0.0–0.1)
Basophil %: 0.2 %
Eosinophil #: 0 10*3/uL (ref 0.0–0.7)
Eosinophil %: 0.2 %
HCT: 27.1 % — AB (ref 35.0–47.0)
HGB: 9 g/dL — AB (ref 12.0–16.0)
Lymphocyte #: 0.5 10*3/uL — ABNORMAL LOW (ref 1.0–3.6)
Lymphocyte %: 3.6 %
MCH: 36.8 pg — ABNORMAL HIGH (ref 26.0–34.0)
MCHC: 33.1 g/dL (ref 32.0–36.0)
MCV: 111 fL — ABNORMAL HIGH (ref 80–100)
MONOS PCT: 5.1 %
Monocyte #: 0.8 x10 3/mm (ref 0.2–0.9)
Neutrophil #: 14 10*3/uL — ABNORMAL HIGH (ref 1.4–6.5)
Neutrophil %: 90.9 %
Platelet: 211 10*3/uL (ref 150–440)
RBC: 2.43 10*6/uL — AB (ref 3.80–5.20)
RDW: 15.5 % — ABNORMAL HIGH (ref 11.5–14.5)
WBC: 15.3 10*3/uL — AB (ref 3.6–11.0)

## 2014-07-12 LAB — MAGNESIUM: Magnesium: 2.1 mg/dL

## 2014-07-12 LAB — ALBUMIN: Albumin: 1.5 g/dL — ABNORMAL LOW (ref 3.4–5.0)

## 2014-07-13 LAB — CBC WITH DIFFERENTIAL/PLATELET
Eosinophil: 1 %
HCT: 30.2 % — AB (ref 35.0–47.0)
HGB: 9.9 g/dL — ABNORMAL LOW (ref 12.0–16.0)
Lymphocytes: 7 %
MCH: 36.3 pg — ABNORMAL HIGH (ref 26.0–34.0)
MCHC: 32.7 g/dL (ref 32.0–36.0)
MCV: 111 fL — ABNORMAL HIGH (ref 80–100)
MONOS PCT: 4 %
Metamyelocyte: 3 %
Platelet: 206 10*3/uL (ref 150–440)
RBC: 2.72 10*6/uL — AB (ref 3.80–5.20)
RDW: 15.6 % — ABNORMAL HIGH (ref 11.5–14.5)
SEGMENTED NEUTROPHILS: 85 %
WBC: 18.3 10*3/uL — AB (ref 3.6–11.0)

## 2014-07-13 LAB — BASIC METABOLIC PANEL
ANION GAP: 7 (ref 7–16)
BUN: 22 mg/dL — ABNORMAL HIGH (ref 7–18)
Calcium, Total: 6.6 mg/dL — CL (ref 8.5–10.1)
Chloride: 106 mmol/L (ref 98–107)
Co2: 23 mmol/L (ref 21–32)
Creatinine: 0.83 mg/dL (ref 0.60–1.30)
EGFR (Non-African Amer.): >60
Glucose: 59 mg/dL — ABNORMAL LOW (ref 65–99)
Osmolality: 273 (ref 275–301)
POTASSIUM: 3.7 mmol/L (ref 3.5–5.1)
Sodium: 136 mmol/L (ref 136–145)

## 2014-07-13 LAB — VANCOMYCIN, TROUGH: Vancomycin, Trough: 22 ug/mL (ref 10–20)

## 2014-07-14 LAB — CBC WITH DIFFERENTIAL/PLATELET
BASOS ABS: 0.1 10*3/uL (ref 0.0–0.1)
Basophil %: 0.4 %
EOS ABS: 0.1 10*3/uL (ref 0.0–0.7)
Eosinophil %: 0.6 %
HCT: 29.5 % — AB (ref 35.0–47.0)
HGB: 9.4 g/dL — AB (ref 12.0–16.0)
LYMPHS PCT: 5.6 %
Lymphocyte #: 1.1 10*3/uL (ref 1.0–3.6)
MCH: 35.2 pg — AB (ref 26.0–34.0)
MCHC: 31.7 g/dL — AB (ref 32.0–36.0)
MCV: 111 fL — AB (ref 80–100)
Monocyte #: 0.7 x10 3/mm (ref 0.2–0.9)
Monocyte %: 3.2 %
Neutrophil #: 18.6 10*3/uL — ABNORMAL HIGH (ref 1.4–6.5)
Neutrophil %: 90.2 %
PLATELETS: 234 10*3/uL (ref 150–440)
RBC: 2.66 10*6/uL — ABNORMAL LOW (ref 3.80–5.20)
RDW: 15.4 % — AB (ref 11.5–14.5)
WBC: 20.6 10*3/uL — ABNORMAL HIGH (ref 3.6–11.0)

## 2014-07-20 ENCOUNTER — Ambulatory Visit: Payer: Self-pay | Admitting: Internal Medicine

## 2014-07-21 ENCOUNTER — Encounter: Payer: Self-pay | Admitting: Adult Health

## 2014-07-24 LAB — COMPREHENSIVE METABOLIC PANEL
ALBUMIN: 2.5 g/dL — AB (ref 3.4–5.0)
ALT: 43 U/L
AST: 61 U/L — AB (ref 15–37)
Alkaline Phosphatase: 261 U/L — ABNORMAL HIGH
Anion Gap: 9 (ref 7–16)
BUN: 13 mg/dL (ref 7–18)
Bilirubin,Total: 0.6 mg/dL (ref 0.2–1.0)
CREATININE: 0.84 mg/dL (ref 0.60–1.30)
Chloride: 96 mmol/L — ABNORMAL LOW (ref 98–107)
Co2: 27 mmol/L (ref 21–32)
EGFR (African American): >60
Glucose: 102 mg/dL — ABNORMAL HIGH (ref 65–99)
Osmolality: 265 (ref 275–301)
POTASSIUM: 3.7 mmol/L (ref 3.5–5.1)
Sodium: 132 mmol/L — ABNORMAL LOW (ref 136–145)
Total Protein: 5.8 g/dL — ABNORMAL LOW (ref 6.4–8.2)

## 2014-07-24 LAB — CBC CANCER CENTER
Basophil #: 0 x10 3/mm (ref 0.0–0.1)
Basophil %: 0.4 %
EOS PCT: 0.7 %
Eosinophil #: 0.1 x10 3/mm (ref 0.0–0.7)
HCT: 34.6 % — AB (ref 35.0–47.0)
HGB: 11.5 g/dL — AB (ref 12.0–16.0)
LYMPHS ABS: 0.6 x10 3/mm — AB (ref 1.0–3.6)
Lymphocyte %: 6.3 %
MCH: 36 pg — AB (ref 26.0–34.0)
MCHC: 33.4 g/dL (ref 32.0–36.0)
MCV: 108 fL — AB (ref 80–100)
MONO ABS: 0.3 x10 3/mm (ref 0.2–0.9)
Monocyte %: 3.1 %
NEUTROS ABS: 8 x10 3/mm — AB (ref 1.4–6.5)
Neutrophil %: 89.5 %
Platelet: 245 x10 3/mm (ref 150–440)
RBC: 3.21 10*6/uL — ABNORMAL LOW (ref 3.80–5.20)
RDW: 15.1 % — ABNORMAL HIGH (ref 11.5–14.5)
WBC: 9 x10 3/mm (ref 3.6–11.0)

## 2014-07-24 LAB — MAGNESIUM: MAGNESIUM: 1.6 mg/dL — AB

## 2014-07-24 LAB — CALCIUM: CALCIUM: 7.8 mg/dL — AB (ref 8.5–10.1)

## 2014-07-25 LAB — PATHOLOGY

## 2014-08-07 LAB — COMPREHENSIVE METABOLIC PANEL
ALBUMIN: 2.4 g/dL — AB (ref 3.4–5.0)
ALK PHOS: 443 U/L — AB
ALT: 44 U/L
Anion Gap: 7 (ref 7–16)
BILIRUBIN TOTAL: 0.5 mg/dL (ref 0.2–1.0)
BUN: 24 mg/dL — AB (ref 7–18)
CHLORIDE: 103 mmol/L (ref 98–107)
CREATININE: 1 mg/dL (ref 0.60–1.30)
Calcium, Total: 8 mg/dL — ABNORMAL LOW (ref 8.5–10.1)
Co2: 25 mmol/L (ref 21–32)
EGFR (African American): >60
EGFR (Non-African Amer.): 57 — ABNORMAL LOW
Glucose: 128 mg/dL — ABNORMAL HIGH (ref 65–99)
Osmolality: 276 (ref 275–301)
POTASSIUM: 3.3 mmol/L — AB (ref 3.5–5.1)
SGOT(AST): 82 U/L — ABNORMAL HIGH (ref 15–37)
SODIUM: 135 mmol/L — AB (ref 136–145)
Total Protein: 5.6 g/dL — ABNORMAL LOW (ref 6.4–8.2)

## 2014-08-07 LAB — CBC CANCER CENTER
Basophil #: 0.1 x10 3/mm (ref 0.0–0.1)
Basophil %: 0.7 %
EOS ABS: 0.1 x10 3/mm (ref 0.0–0.7)
Eosinophil %: 1.3 %
HCT: 32.8 % — ABNORMAL LOW (ref 35.0–47.0)
HGB: 10.8 g/dL — ABNORMAL LOW (ref 12.0–16.0)
LYMPHS ABS: 2.2 x10 3/mm (ref 1.0–3.6)
Lymphocyte %: 19.7 %
MCH: 34.7 pg — ABNORMAL HIGH (ref 26.0–34.0)
MCHC: 32.8 g/dL (ref 32.0–36.0)
MCV: 106 fL — ABNORMAL HIGH (ref 80–100)
Monocyte #: 1.2 x10 3/mm — ABNORMAL HIGH (ref 0.2–0.9)
Monocyte %: 10.4 %
NEUTROS ABS: 7.6 x10 3/mm — AB (ref 1.4–6.5)
Neutrophil %: 67.9 %
PLATELETS: 216 x10 3/mm (ref 150–440)
RBC: 3.1 10*6/uL — AB (ref 3.80–5.20)
RDW: 14.9 % — ABNORMAL HIGH (ref 11.5–14.5)
WBC: 11.3 x10 3/mm — AB (ref 3.6–11.0)

## 2014-08-19 ENCOUNTER — Ambulatory Visit: Payer: Self-pay | Admitting: Internal Medicine

## 2014-08-20 LAB — HEPATIC FUNCTION PANEL A (ARMC)
ALBUMIN: 2.4 g/dL — AB (ref 3.4–5.0)
Alkaline Phosphatase: 508 U/L — ABNORMAL HIGH
BILIRUBIN DIRECT: 0.2 mg/dL (ref 0.0–0.2)
Bilirubin,Total: 0.6 mg/dL (ref 0.2–1.0)
SGOT(AST): 99 U/L — ABNORMAL HIGH (ref 15–37)
SGPT (ALT): 58 U/L
TOTAL PROTEIN: 5.6 g/dL — AB (ref 6.4–8.2)

## 2014-08-20 LAB — CBC CANCER CENTER
BASOS PCT: 1.2 %
Basophil #: 0.1 x10 3/mm (ref 0.0–0.1)
EOS ABS: 0.2 x10 3/mm (ref 0.0–0.7)
EOS PCT: 2 %
HCT: 33.1 % — ABNORMAL LOW (ref 35.0–47.0)
HGB: 10.9 g/dL — AB (ref 12.0–16.0)
LYMPHS PCT: 20.8 %
Lymphocyte #: 2.4 x10 3/mm (ref 1.0–3.6)
MCH: 33.6 pg (ref 26.0–34.0)
MCHC: 33.1 g/dL (ref 32.0–36.0)
MCV: 102 fL — AB (ref 80–100)
Monocyte #: 1.2 x10 3/mm — ABNORMAL HIGH (ref 0.2–0.9)
Monocyte %: 10 %
Neutrophil #: 7.8 x10 3/mm — ABNORMAL HIGH (ref 1.4–6.5)
Neutrophil %: 66 %
Platelet: 249 x10 3/mm (ref 150–440)
RBC: 3.26 10*6/uL — ABNORMAL LOW (ref 3.80–5.20)
RDW: 14.5 % (ref 11.5–14.5)
WBC: 11.7 x10 3/mm — AB (ref 3.6–11.0)

## 2014-08-20 LAB — CREATININE, SERUM
CREATININE: 0.94 mg/dL (ref 0.60–1.30)
EGFR (African American): >60
EGFR (Non-African Amer.): >60

## 2014-08-28 LAB — CBC CANCER CENTER
BASOS ABS: 0.1 x10 3/mm (ref 0.0–0.1)
Basophil %: 1.1 %
EOS PCT: 1.5 %
Eosinophil #: 0.1 x10 3/mm (ref 0.0–0.7)
HCT: 31.8 % — AB (ref 35.0–47.0)
HGB: 10.5 g/dL — ABNORMAL LOW (ref 12.0–16.0)
LYMPHS PCT: 12.8 %
Lymphocyte #: 1 x10 3/mm (ref 1.0–3.6)
MCH: 33.4 pg (ref 26.0–34.0)
MCHC: 33.1 g/dL (ref 32.0–36.0)
MCV: 101 fL — AB (ref 80–100)
Monocyte #: 0.4 x10 3/mm (ref 0.2–0.9)
Monocyte %: 5.4 %
NEUTROS ABS: 6.2 x10 3/mm (ref 1.4–6.5)
NEUTROS PCT: 79.2 %
PLATELETS: 125 x10 3/mm — AB (ref 150–440)
RBC: 3.15 10*6/uL — AB (ref 3.80–5.20)
RDW: 14.5 % (ref 11.5–14.5)
WBC: 7.8 x10 3/mm (ref 3.6–11.0)

## 2014-08-28 LAB — POTASSIUM: Potassium: 3.6 mmol/L (ref 3.5–5.1)

## 2014-08-28 LAB — MAGNESIUM: MAGNESIUM: 1.6 mg/dL — AB

## 2014-09-15 LAB — COMPREHENSIVE METABOLIC PANEL
ALK PHOS: 490 U/L — AB
ALT: 39 U/L
ANION GAP: 7 (ref 7–16)
AST: 106 U/L — AB (ref 15–37)
Albumin: 2.2 g/dL — ABNORMAL LOW (ref 3.4–5.0)
BUN: 14 mg/dL (ref 7–18)
Bilirubin,Total: 0.8 mg/dL (ref 0.2–1.0)
Calcium, Total: 7.9 mg/dL — ABNORMAL LOW (ref 8.5–10.1)
Chloride: 96 mmol/L — ABNORMAL LOW (ref 98–107)
Co2: 27 mmol/L (ref 21–32)
Creatinine: 0.77 mg/dL (ref 0.60–1.30)
GLUCOSE: 114 mg/dL — AB (ref 65–99)
Osmolality: 262 (ref 275–301)
Potassium: 3.8 mmol/L (ref 3.5–5.1)
Sodium: 130 mmol/L — ABNORMAL LOW (ref 136–145)
TOTAL PROTEIN: 5.3 g/dL — AB (ref 6.4–8.2)

## 2014-09-15 LAB — CBC CANCER CENTER
BASOS PCT: 0.9 %
Basophil #: 0.1 x10 3/mm (ref 0.0–0.1)
Eosinophil #: 0 x10 3/mm (ref 0.0–0.7)
Eosinophil %: 0.4 %
HCT: 27.9 % — AB (ref 35.0–47.0)
HGB: 9.5 g/dL — AB (ref 12.0–16.0)
LYMPHS ABS: 1.7 x10 3/mm (ref 1.0–3.6)
Lymphocyte %: 17.7 %
MCH: 34.6 pg — ABNORMAL HIGH (ref 26.0–34.0)
MCHC: 34 g/dL (ref 32.0–36.0)
MCV: 102 fL — ABNORMAL HIGH (ref 80–100)
Monocyte #: 1.3 x10 3/mm — ABNORMAL HIGH (ref 0.2–0.9)
Monocyte %: 12.8 %
NEUTROS ABS: 6.7 x10 3/mm — AB (ref 1.4–6.5)
Neutrophil %: 68.2 %
Platelet: 204 x10 3/mm (ref 150–440)
RBC: 2.74 10*6/uL — ABNORMAL LOW (ref 3.80–5.20)
RDW: 18.9 % — ABNORMAL HIGH (ref 11.5–14.5)
WBC: 9.8 x10 3/mm (ref 3.6–11.0)

## 2014-09-17 LAB — CANCER ANTIGEN 27.29: CA 27.29: 79.2 U/mL — ABNORMAL HIGH (ref 0.0–38.6)

## 2014-09-19 ENCOUNTER — Ambulatory Visit: Payer: Self-pay | Admitting: Oncology

## 2014-09-29 LAB — CBC CANCER CENTER
BASOS PCT: 0.7 %
Basophil #: 0.1 x10 3/mm (ref 0.0–0.1)
EOS PCT: 1 %
Eosinophil #: 0.2 x10 3/mm (ref 0.0–0.7)
HCT: 28.7 % — AB (ref 35.0–47.0)
HGB: 9.5 g/dL — ABNORMAL LOW (ref 12.0–16.0)
Lymphocyte #: 2 x10 3/mm (ref 1.0–3.6)
Lymphocyte %: 12.8 %
MCH: 33.6 pg (ref 26.0–34.0)
MCHC: 33 g/dL (ref 32.0–36.0)
MCV: 102 fL — ABNORMAL HIGH (ref 80–100)
MONO ABS: 2 x10 3/mm — AB (ref 0.2–0.9)
MONOS PCT: 12.5 %
NEUTROS ABS: 11.5 x10 3/mm — AB (ref 1.4–6.5)
Neutrophil %: 73 %
Platelet: 250 x10 3/mm (ref 150–440)
RBC: 2.82 10*6/uL — AB (ref 3.80–5.20)
RDW: 18.4 % — ABNORMAL HIGH (ref 11.5–14.5)
WBC: 15.7 x10 3/mm — AB (ref 3.6–11.0)

## 2014-09-29 LAB — COMPREHENSIVE METABOLIC PANEL
ALK PHOS: 489 U/L — AB
ALT: 24 U/L
AST: 120 U/L — AB (ref 15–37)
Albumin: 1.9 g/dL — ABNORMAL LOW (ref 3.4–5.0)
Anion Gap: 9 (ref 7–16)
BILIRUBIN TOTAL: 0.7 mg/dL (ref 0.2–1.0)
BUN: 19 mg/dL — ABNORMAL HIGH (ref 7–18)
CALCIUM: 7.2 mg/dL — AB (ref 8.5–10.1)
CHLORIDE: 99 mmol/L (ref 98–107)
CO2: 22 mmol/L (ref 21–32)
CREATININE: 1.12 mg/dL (ref 0.60–1.30)
GFR CALC NON AF AMER: 50 — AB
Glucose: 90 mg/dL (ref 65–99)
Osmolality: 263 (ref 275–301)
Potassium: 4.7 mmol/L (ref 3.5–5.1)
Sodium: 130 mmol/L — ABNORMAL LOW (ref 136–145)
Total Protein: 5.3 g/dL — ABNORMAL LOW (ref 6.4–8.2)

## 2014-10-14 ENCOUNTER — Inpatient Hospital Stay: Payer: Self-pay | Admitting: Internal Medicine

## 2014-10-14 LAB — COMPREHENSIVE METABOLIC PANEL
ALBUMIN: 1.7 g/dL — AB (ref 3.4–5.0)
ALK PHOS: 749 U/L — AB (ref 46–116)
Anion Gap: 10 (ref 7–16)
BILIRUBIN TOTAL: 1.1 mg/dL — AB (ref 0.2–1.0)
BUN: 44 mg/dL — ABNORMAL HIGH (ref 7–18)
CHLORIDE: 97 mmol/L — AB (ref 98–107)
CO2: 21 mmol/L (ref 21–32)
CREATININE: 2.06 mg/dL — AB (ref 0.60–1.30)
Calcium, Total: 7.4 mg/dL — ABNORMAL LOW (ref 8.5–10.1)
EGFR (African American): 30 — ABNORMAL LOW
EGFR (Non-African Amer.): 25 — ABNORMAL LOW
Glucose: 78 mg/dL (ref 65–99)
Osmolality: 267 (ref 275–301)
POTASSIUM: 5.8 mmol/L — AB (ref 3.5–5.1)
SGOT(AST): 141 U/L — ABNORMAL HIGH (ref 15–37)
SGPT (ALT): 29 U/L (ref 14–63)
Sodium: 128 mmol/L — ABNORMAL LOW (ref 136–145)
Total Protein: 5.5 g/dL — ABNORMAL LOW (ref 6.4–8.2)

## 2014-10-14 LAB — URINALYSIS, COMPLETE
BILIRUBIN, UR: NEGATIVE
Blood: NEGATIVE
Glucose,UR: NEGATIVE mg/dL (ref 0–75)
Hyaline Cast: 2
KETONE: NEGATIVE
Leukocyte Esterase: NEGATIVE
Nitrite: NEGATIVE
Ph: 5 (ref 4.5–8.0)
Protein: NEGATIVE
RBC,UR: <1 /HPF (ref 0–5)
Specific Gravity: 1.012 (ref 1.003–1.030)
WBC UR: 1 /HPF (ref 0–5)

## 2014-10-14 LAB — CBC
HCT: 26.8 % — ABNORMAL LOW (ref 35.0–47.0)
HGB: 8.7 g/dL — ABNORMAL LOW (ref 12.0–16.0)
MCH: 33.3 pg (ref 26.0–34.0)
MCHC: 32.5 g/dL (ref 32.0–36.0)
MCV: 102 fL — ABNORMAL HIGH (ref 80–100)
Platelet: 223 10*3/uL (ref 150–440)
RBC: 2.62 10*6/uL — ABNORMAL LOW (ref 3.80–5.20)
RDW: 18.3 % — ABNORMAL HIGH (ref 11.5–14.5)
WBC: 15 10*3/uL — ABNORMAL HIGH (ref 3.6–11.0)

## 2014-10-14 LAB — TROPONIN I: Troponin-I: <0.02 ng/mL

## 2014-10-15 LAB — BASIC METABOLIC PANEL
ANION GAP: 11 (ref 7–16)
BUN: 40 mg/dL — ABNORMAL HIGH (ref 7–18)
CALCIUM: 7.3 mg/dL — AB (ref 8.5–10.1)
CHLORIDE: 99 mmol/L (ref 98–107)
Co2: 19 mmol/L — ABNORMAL LOW (ref 21–32)
Creatinine: 1.95 mg/dL — ABNORMAL HIGH (ref 0.60–1.30)
GFR CALC AF AMER: 32 — AB
GFR CALC NON AF AMER: 27 — AB
Glucose: 70 mg/dL (ref 65–99)
OSMOLALITY: 267 (ref 275–301)
POTASSIUM: 5.1 mmol/L (ref 3.5–5.1)
Sodium: 129 mmol/L — ABNORMAL LOW (ref 136–145)

## 2014-10-15 LAB — CBC WITH DIFFERENTIAL/PLATELET
BASOS PCT: 0.3 %
Basophil #: 0 10*3/uL (ref 0.0–0.1)
EOS PCT: 1 %
Eosinophil #: 0.1 10*3/uL (ref 0.0–0.7)
HCT: 28.9 % — ABNORMAL LOW (ref 35.0–47.0)
HGB: 9 g/dL — AB (ref 12.0–16.0)
Lymphocyte #: 0.9 10*3/uL — ABNORMAL LOW (ref 1.0–3.6)
Lymphocyte %: 7.2 %
MCH: 32.3 pg (ref 26.0–34.0)
MCHC: 31.1 g/dL — AB (ref 32.0–36.0)
MCV: 104 fL — AB (ref 80–100)
MONO ABS: 0.7 x10 3/mm (ref 0.2–0.9)
Monocyte %: 5.5 %
Neutrophil #: 11.3 10*3/uL — ABNORMAL HIGH (ref 1.4–6.5)
Neutrophil %: 86 %
Platelet: 209 10*3/uL (ref 150–440)
RBC: 2.79 10*6/uL — AB (ref 3.80–5.20)
RDW: 18.4 % — ABNORMAL HIGH (ref 11.5–14.5)
WBC: 13.1 10*3/uL — ABNORMAL HIGH (ref 3.6–11.0)

## 2014-10-16 LAB — URINE CULTURE

## 2014-10-17 LAB — CBC WITH DIFFERENTIAL/PLATELET
BASOS ABS: 0.1 10*3/uL (ref 0.0–0.1)
Basophil %: 1.1 %
Eosinophil #: 0.2 10*3/uL (ref 0.0–0.7)
Eosinophil %: 1.4 %
HCT: 29.5 % — ABNORMAL LOW (ref 35.0–47.0)
HGB: 9.3 g/dL — ABNORMAL LOW (ref 12.0–16.0)
Lymphocyte #: 1.1 10*3/uL (ref 1.0–3.6)
Lymphocyte %: 8.8 %
MCH: 32.3 pg (ref 26.0–34.0)
MCHC: 31.5 g/dL — AB (ref 32.0–36.0)
MCV: 103 fL — AB (ref 80–100)
MONOS PCT: 3.9 %
Monocyte #: 0.5 x10 3/mm (ref 0.2–0.9)
Neutrophil #: 10.3 10*3/uL — ABNORMAL HIGH (ref 1.4–6.5)
Neutrophil %: 84.8 %
Platelet: 189 10*3/uL (ref 150–440)
RBC: 2.88 10*6/uL — ABNORMAL LOW (ref 3.80–5.20)
RDW: 17.9 % — AB (ref 11.5–14.5)
WBC: 12.1 10*3/uL — AB (ref 3.6–11.0)

## 2014-10-17 LAB — BASIC METABOLIC PANEL
ANION GAP: 11 (ref 7–16)
BUN: 46 mg/dL — ABNORMAL HIGH (ref 7–18)
CHLORIDE: 101 mmol/L (ref 98–107)
CO2: 18 mmol/L — AB (ref 21–32)
Calcium, Total: 7.3 mg/dL — ABNORMAL LOW (ref 8.5–10.1)
Creatinine: 1.96 mg/dL — ABNORMAL HIGH (ref 0.60–1.30)
EGFR (African American): 32 — ABNORMAL LOW
EGFR (Non-African Amer.): 26 — ABNORMAL LOW
Glucose: 72 mg/dL (ref 65–99)
Osmolality: 271 (ref 275–301)
Potassium: 5.2 mmol/L — ABNORMAL HIGH (ref 3.5–5.1)
Sodium: 130 mmol/L — ABNORMAL LOW (ref 136–145)

## 2014-10-19 LAB — CULTURE, BLOOD (SINGLE)

## 2014-10-20 ENCOUNTER — Ambulatory Visit: Payer: Self-pay | Admitting: Internal Medicine

## 2014-10-20 DEATH — deceased

## 2014-11-18 DEATH — deceased

## 2015-01-09 NOTE — Op Note (Signed)
PATIENT NAME:  Christine Strickland, DOCTER MR#:  [id number] DATE OF BIRTH:  [birth year]  DATE OF PROCEDURE:  08/01/2013  PREOPERATIVE DIAGNOSIS: Right hip intertrochanteric hip fracture.   POSTOPERATIVE DIAGNOSIS: Right hip intertrochanteric hip fracture.   PROCEDURE PERFORMED: Intramedullary nail fixation of right intertrochanteric hip fracture.   SURGEON: Dawayne Patricia, M.D.   ASSISTANT: None.   ANESTHESIA: General.   ESTIMATED BLOOD LOSS: 125 mL.   URINE OUTPUT: 200 mL.   INTRAVENOUS FLUIDS: 850 mL.   ANTIBIOTICS: One g of Ancef at 8:34.   IMPLANTS: Biomet Affixus hip fracture nail.   COMPLICATIONS: None.   DISPOSITION: The patient will be weight-bearing as tolerated postoperatively. She will be given 24 hours of prophylactic antibiotics. She will be placed on Lovenox for DVT prophylaxis.   INDICATIONS FOR PROCEDURE: [name] is a 79 year old female who has recently completed chemotherapy for a diagnosis of breast cancer. She was at home a few days ago and had sustained a fall. She was not found for several hours. She was brought to the Emergency Room and admitted for diagnosis of intertrochanteric hip fracture. All risks and benefits of surgery were explained to the patient and her son. They decided to proceed.   DESCRIPTION OF PROCEDURE: The right lower extremity was marked as operative site. Spinal anesthesia was attempted but was unsuccessful. General anesthesia was administered.   The patient was placed on the fracture table. She was secured. Bony prominences were padded. The left lower extremity placed in a well-leg holder. The right lower extremity was placed in traction, and under fluoroscopy, the fracture was reduced. The right lower extremity was prepared and draped in the usual sterile fashion. A surgical timeout was performed.   Under fluoroscopic guidance, the tip of the greater trochanter was marked. Just proximal to this, a longitudinal incision was made and sharp dissection  was carried through the fascia. Blunt dissection was carried down to the muscle to the tip of the greater trochanter.   Under fluoroscopic guidance, a guidepin was inserted. The proximal cortex was opened with an opening reamer and soft tissue was protected. Ball-tipped guidewire was now inserted to the level of the proximal pole of the patella. Placement of the guidewire was confirmed on orthogonal imaging. The guidewire was measured. Sequential reaming was carried out beginning at 9 mm to a level of 13 mm. All reamings were sent for pathologic analysis given the patient's prior diagnosis of breast cancer.   An 11 x 360 mm nail was inserted. Depth of the nail was confirmed on imaging at the hip and the knee. Using the provided drill guide, a lateral incision was made. Sharp dissection was carried down through the fascia and blunt dissection was carried down to the bone. The drill sleeve was deployed all the way to bone. Under fluoroscopic guidance, a pin was inserted into the femoral neck and head. Proper placement was confirmed on orthogonal imaging. The pin was measured and overdrilled to 90 x 10.5 mm. A 10.5 x 90 mm of hip fracture lag screw was inserted. Traction was released and the fracture was compressed. Lag screw was locked proximally and loosened by a quarter turn.   Attention was turned to the knee. Using standard perfect circle technique, a distal cortical interlock screw was placed. Final radiographs were obtained at the knee and the hip. All wounds were copiously irrigated. Fascia was closed using 0 Vicryl suture. Subcutaneous tissue was closed using 2-0 Vicryl suture. Skin was closed using staples. Sterile dressings were applied.  ____________________________ Dawayne Patricia, MD sr:np D: 08/05/2013 16:16:00 ET T: 08/05/2013 17:02:59 ET JOB#: [id number]  cc: Dawayne Patricia, MD, <Dictator> Dawayne Patricia MD ELECTRONICALLY SIGNED 08/07/2013 9:18

## 2015-01-09 NOTE — Discharge Summary (Signed)
PATIENT NAME:  Christine Strickland, Christine Strickland MR#:  [id number] DATE OF BIRTH:  1936-09-10  DATE OF ADMISSION:  08/26/2013 DATE OF DISCHARGE:  09/02/2013  DISCHARGE DIAGNOSES: 1. Left lower lobe pneumonia with respiratory failure. 2. Encephalopathy, medication and infection-induced, resolved.  3. Hypertension.  4. Hyperlipidemia.  5. Breast cancer.  6. Hypokalemia/hypomagnesemia.    DISCHARGE MEDICATIONS: 1. Calcium 500 mg/200 mg b.i.d.  2. Vitamin D3 2000 units daily.  3. Senna 1 b.i.d. p.r.n.  4. Tylenol 650 mg q.4 p.r.n. pain.  5. Toprol-XL 50 mg daily.  6. Amlodipine 5 mg daily.  7. Ferrous sulfate 7.5 mL b.i.d.  8. Magnesium oxide 400 mg b.i.d.  9. Pantoprazole 40 mg daily.  10. Levaquin 750 mg every other day x 6 days.  11. Nicotine 14 mg patch daily.  12. Torsemide 20 mg daily.  13. Potassium chloride 20 mEq b.i.d.   REASON FOR ADMISSION: A 79 year old female presents with altered mental status and respiratory failure. Please see H and P for HPI, past medical history, and physical exam.   HOSPITAL COURSE: The patient was admitted, placed on IV Zosyn and p.o. Levaquin. Her doses of Haldol and Ativan were discontinued. Her mentation improved back to baseline. Chest x-ray showed a left lower lobe infiltrate, that improved over time. She was able to come off oxygen, fever went away. White count normalized. She had extensive trouble with hypokalemia and hypomagnesemia with aggressive replacement due to to hydration. She had some left flank pain that was treated with Lasix and now torsemide. We will need to follow potassium and magnesium closely as an outpatient. Overall prognosis is guarded. Hopefully, she can get strong with OT, PT and go home.   ____________________________ Rusty Aus, MD mfm:sg D: 09/02/2013 07:20:00 ET T: 09/02/2013 08:26:05 ET JOB#: [id number]  cc: Rusty Aus, MD, <Dictator>  MARK Roselee Culver MD ELECTRONICALLY SIGNED 09/03/2013 7:38

## 2015-01-09 NOTE — H&P (Signed)
PATIENT NAME:  Christine Strickland, RADICH MR#:  [id number] DATE OF BIRTH:  17-Mar-1936  DATE OF ADMISSION:  08/26/2013  REFERRING PHYSICIAN: Dr. Joni Fears.   PRIMARY CARE PHYSICIAN: Dr. Emily Filbert.   CHIEF COMPLAINT: Altered mental status.   HISTORY OF PRESENT ILLNESS: This is a 79 year old Caucasian female with past medical history of breast cancer, hypertension, hyperlipidemia, recent open reduction and internal fixation for right hip fracture performed 08/05/2013, presenting with altered mental status. The patient currently resides at [place] facility since hip fracture. Apparently slowly progressive decline in mental status since early December, however, acutely worsening over the last 1 to 2 days' duration per family at bedside. They described this mental status as "delirium" and her being more lethargic. They noticed that she had an associated cough of approximately 2 days' duration; however, nonproductive with no known fevers or chills. The patient is unable to provide further information given medical condition and mental status. She was actually brought to [facility] for further workup and evaluation of altered mental status earlier today. Had a CT head as well as chest x-ray performed and transferred back to [place]. Brought back once again with concern for altered mental status that was persistent. Once again, she is unable to provide any further information given mental status and medical condition.   REVIEW OF SYSTEMS: Unobtainable given medical condition and mental status.   PAST MEDICAL HISTORY: Hypertension, hyperlipidemia, essential thrombocytosis, breast cancer, COPD.    SOCIAL HISTORY: No recent tobacco, alcohol or drug usage. Currently resides at [place]. Prior to being at [facility], she did use tobacco and alcohol on occasion.   FAMILY HISTORY: Positive for coronary artery disease.   ALLERGIES: No known drug allergies.   HOME  MEDICATIONS: Acetaminophen 650 mg extended release p.o. 3 times daily, albuterol/ipratropium 2.5/0.5 mg inhalation q.6 hours as needed for shortness of breath, Norvasc 5 mg p.o. daily, bisacodyl 10 mg rectal suppository as needed for constipation, Colace 100 mg as needed for constipation, Lovenox 40 mg injection subcutaneous daily, ferrous sulfate 7.5 mL 2 times daily, Haldol 0.5 mg 4 times daily as needed for agitation, lactulose 2 mg rectal as needed for constipation, magnesium oxide 400 mg p.o. b.i.d., metoprolol extended release 50 mg p.o. daily, nicotine patch 14 mg q.24 hour patch, Zofran 4 mg tablet every 8 hours needed for nausea, oxycodone 5 mg as needed q.4 hours for pain, Os-Cal Plus D 500/200 two times daily, pantoprazole 40 mg p.o. daily, quetiapine 25 mg b.i.d. as needed for agitation, Senna 8.6 mg b.i.d. as needed for constipation, tramadol 50 mg 1 to 2 tablets q.4 hours as needed for pain, Tylenol Extra Strength 500 mg 2 tablets q.6 hours for pain, vitamin D3 2000 international units once daily, vitamin K cream applied topically to affected area daily as needed.   PHYSICAL EXAMINATION:  VITAL SIGNS: Temperature 97.8, pulse 80, respirations 17, blood pressure 150/62, saturating 100% on 3 liters nasal cannula. Weight 59.9 kg, BMI 22.  GENERAL: Chronically ill-appearing, frail, Caucasian female, currently in minimal to moderate distress secondary to mental status.  HEAD: Normocephalic, atraumatic.  EYES: Pupils equal, round and reactive to light. Unable to fully assess extraocular muscles; however, there is no scleral icterus.  MOUTH: Dry mucosal membranes. Dentition intact. No abscesses noted.  EARS, NOSE, THROAT: Clear without exudate. No external lesions.  NECK: Supple. No thyromegaly. No nodules. No JVD.  PULMONARY: Coarse breath sounds with scattered wheezing. No use of accessory muscles. Good respiratory effort.  CHEST: Nontender to palpation.  CARDIOVASCULAR: S1, S2, regular rate and  rhythm. No murmurs, rubs or gallops. Edema 2 to 3+ bilaterally to the knees. Pedal pulses 2+ bilaterally.  GASTROINTESTINAL: Soft, nontender, nondistended. No masses. Positive bowel sounds. No hepatosplenomegaly.  MUSCULOSKELETAL: No swelling, clubbing. Positive for edema as described above. Passive range of motion full in all extremities.  NEUROLOGIC: Unable to fully assess given the patient's mental status and medical condition.  SKIN: No ulcerations, lesions, rashes, cyanosis. Skin warm, dry. Turgor is intact.  PSYCHIATRIC: Unable to fully assess given the patient's mental status and medical condition   LABORATORY DATA: Sodium 134, potassium 3.4, chloride 103, bicarb 23, BUN 8, creatinine 0.85, glucose 97. Total protein 5, albumin 2.4, bilirubin 0.3, alk phos 120, AST 15, ALT 11. Troponin I less than 0.02. WBC 12.6, hemoglobin 9, platelets 362. Urinalysis negative for evidence of infection. ABG performed, 7.3/43/121/21 on nasal cannula. Lactic acid 0.4. CT head performed revealing mild diffuse atrophy. Chest x-ray findings concerning for left lower lobe pneumonia with associated pleural effusion. CT chest performed. Negative for PE. Positive for left and right effusions with bilateral compressive atelectasis.   ASSESSMENT AND PLAN: A 79 year old Caucasian female with history of breast cancer, hypertension, hyperlipidemia, residing at [place], presenting with altered mental status.  1. Acute respiratory failure with hypoxemia secondary to healthcare-acquired pneumonia. Provide supplemental oxygen to keep oxygen saturation greater than 92%. DuoNeb therapy q.4 hours. Flutter valve therapy when she is able. Blood and sputum cultures. Antibiotic coverage with Levaquin and Zosyn given recent hospitalization and currently residing at a nursing facility. Strep and Legionella urinary antigens.  2. Hypomagnesemia: Replace magnesium.  3. Encephalopathy: Possible complication of infection versus  polypharmacy. Will hold benzodiazepines and limit further sedating agents, including pain medications.  4. Hypertension: Continue with Lopressor.  5. Gastroesophageal reflux disease: Pantoprazole.  6. Venous thromboembolism prophylaxis: On Lovenox.   The patient is FULL CODE per family at bedside.   TIME SPENT: 45 minutes.    ____________________________ Aaron Mose. Hower, MD dkh:gb D: 08/26/2013 23:05:26 ET T: 08/26/2013 23:16:45 ET JOB#: [id number]  cc: Aaron Mose. Hower, MD, <Dictator> DAVID Woodfin Ganja MD ELECTRONICALLY SIGNED 08/28/2013 0:31

## 2015-01-09 NOTE — Op Note (Signed)
PATIENT NAME:  Christine Strickland, Christine Strickland MR#:  [id number] DATE OF BIRTH:  26-Aug-1936  DATE OF PROCEDURE:  08/01/2013  PREOPERATIVE DIAGNOSIS: Right hip intertrochanteric hip fracture.   POSTOPERATIVE DIAGNOSIS: Right hip intertrochanteric hip fracture.   PROCEDURE PERFORMED: Intramedullary nail fixation of right intertrochanteric hip fracture.   SURGEON: Dawayne Patricia, M.D.   ASSISTANT: None.   ANESTHESIA: General.   ESTIMATED BLOOD LOSS: 125 mL.   URINE OUTPUT: 200 mL.   INTRAVENOUS FLUIDS: 850 mL.   ANTIBIOTICS: One g of Ancef at 8:34.   IMPLANTS: Biomet Affixus hip fracture nail.   COMPLICATIONS: None.   DISPOSITION: The patient will be weight-bearing as tolerated postoperatively. She will be given 24 hours of prophylactic antibiotics. She will be placed on Lovenox for DVT prophylaxis.   INDICATIONS FOR PROCEDURE: Christine Strickland is a 79 year old female who has recently completed chemotherapy for a diagnosis of breast cancer. She was at home a few days ago and had sustained a fall. She was not found for several hours. She was brought to the Emergency Room and admitted for diagnosis of intertrochanteric hip fracture. All risks and benefits of surgery were explained to the patient and her son. They decided to proceed.   DESCRIPTION OF PROCEDURE: The right lower extremity was marked as operative site. Spinal anesthesia was attempted but was unsuccessful. General anesthesia was administered.   The patient was placed on the fracture table. She was secured. Bony prominences were padded. The left lower extremity placed in a well-leg holder. The right lower extremity was placed in traction, and under fluoroscopy, the fracture was reduced. The right lower extremity was prepared and draped in the usual sterile fashion. A surgical timeout was performed.   Under fluoroscopic guidance, the tip of the greater trochanter was marked. Just proximal to this, a longitudinal incision was made and sharp dissection  was carried through the fascia. Blunt dissection was carried down to the muscle to the tip of the greater trochanter.   Under fluoroscopic guidance, a guidepin was inserted. The proximal cortex was opened with an opening reamer and soft tissue was protected. Ball-tipped guidewire was now inserted to the level of the proximal pole of the patella. Placement of the guidewire was confirmed on orthogonal imaging. The guidewire was measured. Sequential reaming was carried out beginning at 9 mm to a level of 13 mm. All reamings were sent for pathologic analysis given the patient's prior diagnosis of breast cancer.   An 11 x 360 mm nail was inserted. Depth of the nail was confirmed on imaging at the hip and the knee. Using the provided drill guide, a lateral incision was made. Sharp dissection was carried down through the fascia and blunt dissection was carried down to the bone. The drill sleeve was deployed all the way to bone. Under fluoroscopic guidance, a pin was inserted into the femoral neck and head. Proper placement was confirmed on orthogonal imaging. The pin was measured and overdrilled to 90 x 10.5 mm. A 10.5 x 90 mm of hip fracture lag screw was inserted. Traction was released and the fracture was compressed. Lag screw was locked proximally and loosened by a quarter turn.   Attention was turned to the knee. Using standard perfect circle technique, a distal cortical interlock screw was placed. Final radiographs were obtained at the knee and the hip. All wounds were copiously irrigated. Fascia was closed using 0 Vicryl suture. Subcutaneous tissue was closed using 2-0 Vicryl suture. Skin was closed using staples. Sterile dressings were applied.  ____________________________ Dawayne Patricia, MD sr:np D: 08/05/2013 16:16:00 ET T: 08/05/2013 17:02:59 ET JOB#: [id number]  cc: Dawayne Patricia, MD, <Dictator> Dawayne Patricia MD ELECTRONICALLY SIGNED 08/07/2013 9:18

## 2015-01-09 NOTE — Op Note (Signed)
PATIENT NAME:  Christine Strickland, Christine Strickland MR#:  [id number] DATE OF BIRTH:  [birth year]  DATE OF PROCEDURE:  12/27/2012  PREOPERATIVE DIAGNOSIS: Advanced breast cancer.   POSTOPERATIVE DIAGNOSIS: Advanced breast cancer.   OPERATIVE PROCEDURE: Right subclavian power port placement.   OPERATING SURGEON: Hervey Ard, MD.   ANESTHESIA: Attended local, 10 mL 1% plain Xylocaine.   ESTIMATED BLOOD LOSS: Minimal.  CLINICAL NOTE: This 79 year old woman was found to have advanced left breast cancer with supraclavicular nodes. She is felt to be a candidate for neoadjuvant chemotherapy.   OPERATIVE NOTE: With the patient under adequate sedation, the right neck and chest was prepped with ChloraPrep and draped. Ultrasound was used to confirm patency of the right subclavian vein. The vein was punctured on a single stick. There was some initial difficulty with advancement of the guidewire and fluoroscopy showed that it was hung on the origin of the right internal jugular vein. It was admitted eventually advanced into the SVC and the dilator placed. The catheter was then positioned in the junction of the SVC and right atrium. It was tunneled to a pocket on the right anterior chest. The catheter easily irrigated and aspirated when connected to the port itself. The port was anchored with 3-point fixation to the deep tissue. The wound was closed in layers with running 3-0 Vicryl to the adipose tissue and a running 4-0 Vicryl subcuticular suture for the skin. Benzoin and Steri-Strips followed by Telfa and Tegaderm dressing was applied. The patient tolerated the procedure well and was taken to the recovery room in stable condition. Erect portable chest x-ray in the recovery room showed no evidence of pneumothorax and the catheter tip as described above.   ____________________________ Robert Bellow, MD jwb:aw D: 12/27/2012 10:55:05 ET T: 12/27/2012 11:01:20 ET JOB#: [id number]  cc: Robert Bellow, MD, <Dictator> Simonne Come. Inez Pilgrim, MD Rusty Aus, MD Adylee Leonardo Amedeo Kinsman MD ELECTRONICALLY SIGNED 12/27/2012 11:49

## 2015-01-09 NOTE — Discharge Summary (Signed)
PATIENT NAME:  Christine Strickland, Christine Strickland MR#:  [id number] DATE OF BIRTH:  02-16-1936  DATE OF ADMISSION:  07/30/2013 DATE OF DISCHARGE:  08/05/2013  DISCHARGE DIAGNOSES: 1.  Acute right hip fracture, post open reduction and internal fixation.  2.  Hypertension.  3.  Breast carcinoma, post chemotherapy.  4.  Chronic obstructive pulmonary disease.  5.  Tobacco abuse.  6.  Hyponatremia/hypomagnesemia   DISCHARGE MEDICATIONS: 1.  Toprol-XL 50 mg daily. 2.  Norvasc 5 mg daily. 3.  Ondansetron 4 mg q. 8 p.r.n. nausea.  4.  Lactulose 15 mL q.i.d. p.r.n. constipation. 5.  Colace 1 daily.  6.  Lovenox 40 mg daily.  7.  Sliding scale insulin. 8.  DuoNebs t.i.d.  9.  Ferrous sulfate 325 mg b.i.d.  10.  Pantoprazole 40 mg daily.  11.  Nicotine 21 mg patch daily. 12.  Oxycodone 5 mg q. 4 p.r.n. pain.  13.  Magnesium oxide 400 mg daily.   REASON FOR ADMISSION: A 79 year old female who presents with right hip fracture. Please see H and P for HPI, past medical history, and physical exam.   HOSPITAL COURSE: The patient was admitted and underwent right hip ORIF with really no complications. Her hemoglobin did drop down to 7.2. She was transfused and was 8.5 on discharge. She has hyponatremia and hypomagnesemia treated with normal saline and magnesium replacement and potassium replacement. All of those were stable on discharge. She will remain on magnesium. Ultimately, post rehab of her hip with PT and OT, she will need radiation for her breast cancer. Overall prognosis is guarded.  ____________________________ Rusty Aus, MD mfm:sb D: 08/05/2013 08:05:23 ET T: 08/05/2013 08:20:43 ET JOB#: [id number]  cc: Rusty Aus, MD, <Dictator> Tearia Gibbs Roselee Culver MD ELECTRONICALLY SIGNED 08/07/2013 8:33

## 2015-01-09 NOTE — Consult Note (Signed)
Reason for Visit: This 79 year old Female patient presents to the clinic for initial evaluation of  breast cancer .   Referred by Dr. Cynda Acres.  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 She recently presented with a left breast mass and workup showed left supraclavicular adenopathy which was biopsied and positive for invasive mammary carcinoma triple negative disease unlike her prior histology. PET CT scan demonstrated hypermetabolic activity in the left breast left axilla supraclavicular region and lower cervical nodal chain. She underwent neoadjuvant chemotherapy with Cytoxan Adriamycin plus Taxol. Repeat PET/CT scan showed complete response with no hypermetabolic activity in those regions. Her case was discussed at our weekly tumor conference and based on the extensive nature her disease mastectomy was not thought to provide beneficial input. We have discussed the case and opted to go with involved field radiation therapy to her left breast and peripheral lymphatics. She seen today for consultation. She had recently had a fall has a bleeding lesion on her lower extremity which we are addressing with antibacterial cream and dressing. She specifically denies breast tenderness cough or bone pain.  Past Hx:     scoliosis:    renal vascular htn:    lobular breast cancer:    essential thrombocytosis:    spinal surgery: May 2005  Past, Family and Social History:  Past Medical History positive   Cardiovascular renovascular hypertension   Immunologic essential thrombocytosis   Past Surgical History spinal surgery,   Past Medical History Comments lobular carcinoma the right breast status post radiation therapy, scoliosis   Family History noncontributory   Social History noncontributory   Additional Past Medical and Surgical History accompanied by son today   Allergies:   No Known Allergies:   Home Meds:  Home Medications: Medication Instructions Status  lactulose 10 g/15 mL oral syrup 15 milliliter(s) orally 4 times a day illiliter(s) orally 4 times a day as needed for constipation Active  ondansetron 1 tab(s) orally every 8 hours, As Needed - for Nausea, Vomiting Active  traMADol 50 mg oral tablet 1 tab(s) orally every 4 hours, As needed, pain Active  Toprol-XL 50 mg oral tablet, extended release 1 tab(s) orally once a day in am Active  Norvasc 5 mg oral tablet 1 tab(s) orally once a day inam Active  docusate sodium sodium 50 mg oral capsule 1 cap(s) orally once a day, As Needed Active  vitamin K cream Apply topically to affected area once a day, As Needed Active  Tylenol 500 mg oral tablet 2 tab(s) orally every 6 hours, As Needed - for Pain Active   Review of Systems:  General negative   Performance Status (ECOG) 0   Skin see HPI   Breast see HPI   Ophthalmologic negative   ENMT negative   Respiratory and Thorax negative   Cardiovascular negative   Gastrointestinal negative   Genitourinary  negative   Musculoskeletal negative   Neurological negative   Psychiatric negative   Hematology/Lymphatics negative   Endocrine negative   Allergic/Immunologic negative   Review of Systems   review of systems obtained nurse's notes  Nursing Notes:  Nursing  Vital Signs and Chemo Nursing Nursing Notes: *CC Vital Signs Flowsheet:   05-Nov-14 14:22  Temp Temperature 98  Pulse Pulse 88  Respirations Respirations 20  SBP SBP 154  DBP DBP 75  Pain Scale (0-10)  4  Current Weight (kg) (kg) 50.6  Height (cm) centimeters 162.5  BSA (m2) 1.5   Physical Exam:  General/Skin/HEENT:  General normal   Skin normal   Eyes normal   ENMT normal   Head and Neck normal   Additional PE well-developed thin female with alopecia of scalp in NAD. She has prominent firm supraclavicular adenopathy on the left but apparently feels fibrotic secondary to chemotherapy response. No dominant mass or nodularity is noted in either breast into position examined. No axillary adenopathy is appreciated bilaterally. Lungs are clear to A&P cardiac examination shows regular rate and rhythm.   Breasts/Resp/CV/GI/GU:  Respiratory and Thorax normal   Cardiovascular normal   Gastrointestinal normal   Genitourinary normal   MS/Neuro/Psych/Lymph:  Musculoskeletal normal   Neurological normal   Lymphatics normal   Other Results:  Radiology Results: LabUnknown:    18-Feb-14 14:45, PET/CT Scan Breast CA Stage/Restaging  PACS Image     08-Oct-14 11:15, PET/CT Scan Breast CA Stage/Restaging  PACS [place]:    06-Feb-14 13:54, Digital Diagnostic Mammogram Bilateral  Digital Diagnostic Mammogram Bilateral   REASON FOR EXAM:    HX BR CA YRLY  COMMENTS:       PROCEDURE: MAM - MAM DGTL DIAGNOSTIC MAMMO W/CAD  - Oct 25 2012  1:54PM     RESULT: Comparison made to prior exams dated to 12/08/2003. Nodular   parenchymal pattern with benign calcifications. Changes of scarring right   breast. Noted in the upper outer aspect of the left breast is a   parenchymal density. It is suggested patient return for compression spot   films and if need be ultrasound. No other focal abnormalities identified.    IMPRESSION: Parenchymal density upper outer aspect left  breast which   compression spot films and if need be ultrasound suggested for further   evaluation.  BI-RADS: Catagory 0 - Need Additional Imaging Evaluation     A NEGATIVE MAMMOGRAM REPORT DOES NOT PRECLUDE BIOPSY OR OTHER EVALUATION   OF A CLINICALLY PALPABLE OR OTHERWISE SUSPICIOUS MASS OR LESION. BREAST   CANCER MAY NOT BE DETECTED IN UP TO 10% OF CASES.     Thank you for the oppurtunity to contribute to the care of your patient.   Marland Kitchen BREAST COMPOSITION: The breast composition is HETEROGENEOUSLY DENSE   (glandular tissue is 51-75%) This may decrease the sensitivity of   mammography.        Verified By: Osa Craver, M.D., MD  Nuclear Med:    18-Feb-14 14:45, PET/CT Scan Breast CA Stage/Restaging  PET/CT Scan Breast CA Stage/Restaging   REASON FOR EXAM:    Breast CA  COMMENTS:       PROCEDURE: PET - PET/CT RESTG BREAST CA  - Nov 06 2012  2:45PM     RESULT: The patient is undergoing restaging of breast malignancy. The   patient's fasting blood glucose level was 65 mg/dL. The patient received   11.8 mCi of F-18 labeled FDG at 1:08  p.m. with scanning beginning at 2:16   p.m. A noncontrast CT scan was performed at the same sitting for   coregistration and attenuation correction.    Uptake of the radiopharmaceutical within the neck is within the limits of   normal.     There is increased uptake within a medial left supraclavicular lymph node     with maximal SUV of 3.5 with a mean of 2.4. This lymph node measures   approximately 0.8 by 1.4 cm. More posteriorly there is a larger   supraclavicular lymph node with maximal SUV of 7.4 with a mean of 4.6.   This node measures approximately 1.5 cm in diameter.    Within the left axilla there are two hypermetabolic lymph nodes. One has   maximal SUV of 2.5 with a mean of 1.3; the second has maximal SUV of 3.5   with a mean of 2. A left lateral breast mass exhibits maximal SUV of 6.4   with a mean of 3.6. This measures just  under 1 cm in diameter. No   abnormal uptake is seen in the mediastinum or hilar regions. No pulmonary   parenchymal uptake is demonstrated.    Below the hemidiaphragm there is no abnormal uptake demonstrated within   the liver or adrenal glands. Normal expected activity is demonstrated in   the urinary tracts. A small amount of bowel activity is present. There is     a focus of increased uptake along the anterior/superior aspect of the   left hip with maximal SUV of 2.9 with a mean of 1.5. A discrete abnormal   lymph node is not demonstrated here. A small focus of increased uptake   associated with thelateral aspect of the right ischium is demonstrated   with maximal SUV of 2.7 with a mean of 1.7.     The lung parenchyma exhibits no suspicious masses. Previously   demonstrated subcentimeter pulmonary nodules are not demonstrated on this   CT studyfor technical reasons. The patient has undergone extensive   spinal surgery for scoliosis. There are gallstones present. There is no   evidence of obstruction of either kidney.    IMPRESSION:   1. There ia abnormal activity within a left lateral breast mass. There is   abnormal uptake within multiple axillary and supraclavicular lymph nodes.     No abnormal uptake within the pulmonary parenchyma or within the   mediastinal or hilar lymph nodes is demonstrated.  2. There is no abnormal uptake in the liver nor within the adrenal glands.  3. Nonspecific foci of increased uptake associated with the lateral   aspect of the right ischium and the anterior aspect of the left   acetabulum is noted. No definite bony lesions are visible at these sites.  4. There are gallstones present.     Dictation Site: 1        Verified By: DAVID A. Martinique, M.D., MD    08-Oct-14 11:15, PET/CT Scan Breast CA Stage/Restaging  PET/CT Scan Breast CA Stage/Restaging   REASON FOR EXAM:    restaging breast CA  COMMENTS:       PROCEDURE: PET - PET/CT RESTG BREAST  CA  - Jun 26 2013 11:15AM     RESULT: The patient has a fasting blood glucose level measuring 123   mg/dL. The patient received a dose of 12.62 mCi of fluorine 18 labeled   fluorodeoxyglucose in the left forearm at 9:40 a.m. Imaging is performed   from the base  the brain into the thighs between 10:56 a.m. and 11:11 a.m.   Low-dose noncontrast CT is performed over the same regions for the   purposes of attenuation correction and fusion. The low-dose noncontrast   CT, attenuation corrected at images and fused PET/CT data are   reconstructed in the axial, coronal and sagittal planes by the [employer]   software which also reconstruction a rotating 3-dimensional PET image.   Comparison is made to images from the previous PET dated 11/06/2012.  The focal areas of abnormal localization over the axillary and   supraclavicular regions previously are not evident on today's exam. The   left breast abnormal localization seen previously is not evident. There   is a Port-A-Cath device present over the right chest. There is no pleural   or pericardial effusion. There is a calcific density in the caudate lobe   of the liver. There is artifact from the patient's spinal hardware. GI   and GU localization is normal.    IMPRESSION:   1. No focal abnormality evident. No findings to suggest residual or   recurrent malignant disease. Continued followup is recommended.    Dictation Site: 1      Verified By: Sundra Aland, M.D., MD   Relevent Results:   Relevant Scans and Labs mammograms and serial PET/CT scans are reviewed   Assessment and Plan: Impression:   stage IIIc locally advanced invasive mammary carcinoma the left breast with supraclavicular involvement by PET CT criteria with complete response by neoadjuvant chemotherapy now for peripheral lymphatic radiation as well as left breast. In 79 year old female with prior history of right breast cancer Plan:   we have discussed her case her weekly  tumor conference and I agree with the treatment plan to go ahead with radiation therapy to her left breast and peripheral lymphatics. Would treat both areas to 5000 cGy. Would boost the supraclavicular area another 1600 cGy as well as the area of original original tumor involvement by PET CT criteria. Risks and benefits of treatment including possible lymphedema left upper extremity although low chance based on limited lymph node dissection, skin reaction, fatigue, inclusive some superficial lung are explained in detail to the patient and her son. I have set her up for CT simulation early next week. I have discussed the case personally with Dr. Cynda Acres.  I would like to take this opportunity to thank you for allowing me to continue to participate in this patient's care.  CC Referral:  cc: Dr. Emily Filbert   Electronic Signatures: Baruch Gouty, Roda Shutters (MD)  (Signed [phone] 14:50)  Authored: HPI, Diagnosis, Past Hx, PFSH, Allergies, Home Meds, ROS, Nursing Notes, Physical Exam, Other Results, Relevent Results, Encounter Assessment and Plan, CC Referring Physician   Last Updated: 05-Nov-14 14:50 by Armstead Peaks (MD)

## 2015-01-09 NOTE — Consult Note (Signed)
Brief Consult Note: Diagnosis: Right hip IT fracture.   Comments: Patient will be seen in the morning.  Currently in treatment for breast cancer.  Fell while at home overnight.  Was down for several hours until discovered by family.  Complains of pain in the right leg.  No known bone metastatic disease.  Exam deferred until morning.  Imaging: Xrays reveal IT fracture of the right hip. No obvious lesion noted to indicate pathologic lesion.  Assessment: IT fracture ofthe right hip  Plan: Plan for medical optimization followed by IM nail placement tomorrow around 12 noon.  Patient will need electrolyte correction.  Electronic Signatures: Dawayne Patricia (MD)  (Signed 11-Nov-14 19:10)  Authored: Brief Consult Note   Last Updated: 11-Nov-14 19:10 by Dawayne Patricia (MD)

## 2015-01-09 NOTE — H&P (Signed)
PATIENT NAME:  Christine Strickland, OTHMAN MR#:  [id number] DATE OF BIRTH:  1936/03/27  DATE OF ADMISSION:  07/30/2013  PRIMARY CARE PHYSICIAN: Dr. Emily Filbert.   REFERRING ER PHYSICIAN:  Dr. Lenise Arena.    CHIEF COMPLAINT:  Fall.    HISTORY OF PRESENT ILLNESS: The patient is a 79 year old female with a past medical history of hypertension, hyperlipidemia, chronic obstructive pulmonary disease, breast cancer on the right side in 2001 and left side now, on chemo and radiation for the last 6 months, had a fall due to imbalance yesterday. Denies any loss of consciousness, any palpitation or dizziness associated with the fall and after the fall had right-sided hip pain. Came to the Emergency Room and found having a fracture of the right hip and orthopedic doctor requested to admit to medical service for further management due to multiple medical issues.   REVIEW OF SYSTEMS: CONSTITUTIONAL: Negative for fever, fatigue, weakness but has pain in the right hip. No weight loss or weight gain.  EYES: No blurring or double vision. No discharge or redness.  EARS, NOSE, THROAT: No tinnitus, ear pain or hearing loss.  RESPIRATORY: No cough, wheezing, hemoptysis or shortness of breath.  CARDIOVASCULAR: No chest pain, orthopnea, edema or arrhythmia.  GASTROINTESTINAL: No nausea, vomiting, diarrhea, abdominal pain.  GENITOURINARY: No dysuria, hematuria or increased frequency of urination.  ENDOCRINE: No  nocturia or heat or cold intolerance.  SKIN: No acne, rashes or lesions.  MUSCULOSKELETAL: No pain or swelling in the joints except right hip.  NEUROLOGICAL: No numbness, weakness, tremors or vertigo.  PSYCHIATRIC: No anxiety, insomnia, bipolar disorder.   PAST SURGICAL HISTORY: Lumpectomy and back surgery in 2005.   ALLERGIES:  No known drug allergies.     SOCIAL HISTORY: She smokes half pack of cigarettes a day and drinks alcohol, 1 glass of wine with her dinner every day.   FAMILY HISTORY: Significant for  coronary artery disease in her mother.   HOME MEDICATION:  1.  Tylenol 500 mg 2 tablets every 6 hours.  2.  Tramadol 50 mg oral tablet every 4 hours.  3.  Toprol-XL 50 mg oral once a day in the morning.  4.  Ondansetron every 8 hours as needed.  5.  Norvasc 5 mg oral once a day.  6.  Lactulose 10 grams/15 mL, give 15 mL 4 times a day.  7.  Docusate sodium 50 mL oral capsule 1 capsule once a day as needed for constipation.   PHYSICAL EXAMINATION: VITAL SIGNS:  In ER, temperature 97.9, pulse 84, respirations 18 and blood pressure 176/87.  GENERAL: The patient is fully alert and oriented to time, place and person, and does not appear in any acute distress except some pain in the right hip. Cooperative with history-taking and physical examination.  HEENT: Head and neck atraumatic. There is hair loss present on the head because of chemo.  NECK: Supple. No JVD.  RESPIRATORY: Bilateral clear and equal air entry.  CARDIOVASCULAR: S1, S2 present, regular. No murmur.  ABDOMEN: Soft, nontender. Bowel sounds present. No organomegaly.  SKIN: No rashes.  LEGS: No edema.  NEUROLOGICAL: Power 5 out of 5. Follows commands. Moves all 4 limbs except the right lower limbs due to pain.   PSYCHIATRIC: Does not appear in any acute psychiatric illness.  JOINTS: No swelling or tenderness in the joints except right hip.   IMPORTANT LABORATORY AND DIAGNOSTIC RESULTS: Glucose 116, BUN 12, creatine 0.76, sodium 134, potassium 3, chloride 102, CO2 26, calcium 8.1 and  magnesium 1.2. Total protein 5.4, bilirubin 1 and alkaline phosphatase 162. WBC 15.5, hemoglobin 11.3, platelet count 284 and MCV 114. INR is 1. Echocardiogram which was done in April 2014 at the beginning of chemotherapy shows left ventricular ejection fraction 60% to 65%, normal global left ventricular systolic function, normal left ventricular size and wall thickness, left atrium is normal in size and structure.   ASSESSMENT AND PLAN: A 79 year old  female with multiple past medical history, came to the Emergency Room because of fall and right hip fracture. Medical admission for multiple medical issues.  1.  Chronic obstructive pulmonary disease. She is a current smoker though she does not need any home oxygen or inhalers but there might be a component of lung damage because of constant smoking. We will put her on DuoNeb and she has a high risk of respiratory failure. After surgery, because of this we will have to monitor her oxygen status during and after the surgery. She might need steroid after the surgery if she has worsening.  2.  History of breast cancer, on chemotherapy for 6 months and radiation. This might cause restrictive cardiomyopathy. We will have to check an echocardiogram before surgery and will need to be careful with IV fluids during surgery.  3.  Hypertension. Blood pressure is stable and will continue her home medication, amlodipine, metoprolol as she was taking at home.  4.  Elevated white cell count. This might be due to chemotherapy and will just monitor it.  5.  Hypokalemia and hypomagnesemia. We are replacing IV and will check it tomorrow morning.  6.  Current smoker. Smoking cessation counseling done for 3 minutes and the patient agrees to have nicotine patch while in the hospital.   TOTAL TIME SPENT ON THIS ADMISSION: 50 minutes.    ____________________________ Ceasar Lund Anselm Jungling, MD vgv:cs D: 07/30/2013 17:51:56 ET T: 07/30/2013 18:42:06 ET JOB#: [id number]  cc: Ceasar Lund. Anselm Jungling, MD, <Dictator> Rusty Aus, MD Vaughan Basta MD ELECTRONICALLY SIGNED 08/19/2013 10:11

## 2015-01-10 NOTE — Consult Note (Signed)
Brief Consult Note: Diagnosis: PNEUMONIA, HYPOXIA, UNDERLYING BREAST CANCER,METASTATIC TO LUNG, BONE LIVER.   Patient was seen by consultant.   Comments: SEE 07/07/14 PROGRESS NOTE, FROM CLINIC. F/U TODAY AS CONSULT REQUESTED. SEE PROGRESS NOTE DATED TODAY 07/08/14.  Electronic Signatures: Dallas Schimke (MD)  (Signed 20-Oct-15 23:44)  Authored: Brief Consult Note   Last Updated: 20-Oct-15 23:44 by Dallas Schimke (MD)

## 2015-01-10 NOTE — Consult Note (Signed)
Reason for Visit: This 79 year old Female patient presents to the clinic for initial evaluation of  stage IV breast cancer .   Referred by Dr.Gitten.  Diagnosis:  Chief Complaint/Diagnosis   patient is a 79 year old female previously treated in our department 15 years prior for right-sided breast cancer. Now with linvasive mammary carcinoma of the left breast stage IIIc with positive axillary nodes bulky supraclavicular adenopathy with PET positive now in her bones and liver stage IV disease as of March of 2015. I am seeing her today for significant pain in her right lateral ribs plain films showing sclerotic lesion at the ninth rib. She's been treated with weekly Taxol and Xgeva according to medical oncologist last note. She has been started on Xeloda for her bone metastases. Pain is improved with narcotic analgesics. She is wheelchair-bound seen today for an evaluation of her rib metastasis.  Pathology Report pathology report reviewed   Imaging Report PET CT and plain films reviewed   Referral Report clinical notes reviewed   Planned Treatment Regimen palliative radiation therapy to right lateral ribs   HPI   as above  Past Hx:    hip fracture: recent   COPD:    HTN:    scoliosis:    renal vascular htn:    lobular breast cancer:    essential thrombocytosis:    spinal surgery: May 2005  Past, Family and Social History:  Past Medical History positive   Cardiovascular hypertension; renal vascular hypertension   Respiratory COPD   Past Surgical History instrumentation of the spine   Past Medical History Comments scoliosis, essential thrombocytosis   Family History noncontributory   Social History noncontributory   Additional Past Medical and Surgical History accompanied by son today   Allergies:   No Known Allergies:   Home Meds:  Home Medications: Medication Instructions Status  capecitabine 500 mg oral tablet 3 cap(s) orally every 12 hours x 14 days .  DIAGNOSIS CODE 174.9 Active  Norco 325 mg-5 mg oral tablet Take one-half tablet to 1 tablet orally every 8 hours as needed for pain. Active  dexamethasone 2 mg oral tablet 1 tab(s) orally once a day Active  Xeloda 500 mg oral tablet 2 tab(s) orally 2 times a day Take with Xeloda 150 mg tablets... Diagnosis Code: 174.9 Active  Xeloda 150 mg oral tablet 2 tab(s) orally in the morning, and 3 tablets orally in the evening... Diagnosis Code: 174.9 Active  metoprolol succinate 50 mg oral tablet, extended release 1 tab(s) orally once a day Active  amLODIPine 5 mg oral tablet 1 tab(s) orally once a day Active  Senna 8.6 mg oral tablet 1 tab(s) orally 2 times a day, As Needed - for Constipation Active   Review of Systems:  Performance Status (ECOG) 1   Skin negative   Breast see HPI   Ophthalmologic negative   ENMT negative   Respiratory and Thorax negative   Cardiovascular negative   Gastrointestinal negative   Genitourinary negative   Musculoskeletal negative   Neurological negative   Psychiatric negative   Hematology/Lymphatics negative   Endocrine negative   Allergic/Immunologic negative   Review of Systems   review of systems obtained from nurses notes  Nursing Notes:  Nursing Vital Signs and Chemo Nursing Nursing Notes: *CC Vital Signs Flowsheet:   06-May-15 14:08  Temp Temperature 97.1  Pulse Pulse 72  Respirations Respirations 20  SBP SBP 152  DBP DBP 85  Current Weight (kg) (kg) 51.3   Physical Exam:  General/Skin/HEENT:  Skin normal   Eyes normal   ENMT normal   Head and Neck normal   Additional PE well-developed elderly female wheelchair-bound and moderate pain discomfort. She is prominent adenopathy in the left supraclavicular fossa. Lungs are clear to A&P cardiac examination shows regular rate and rhythm. She is exquisitely sensitive to the touch in the right mid rib field. Abdomen is benign.   Breasts/Resp/CV/GI/GU:  Respiratory and Thorax  normal   Cardiovascular normal   Gastrointestinal normal   Genitourinary normal   MS/Neuro/Psych/Lymph:  Musculoskeletal normal   Neurological normal   Lymphatics normal   Other Results:  Radiology Results: XRay:    04-May-15 15:17, Ribs Right Unilateral  Ribs Right Unilateral   REASON FOR EXAM:    rib pain, breast cancer, and bone mets  COMMENTS:       PROCEDURE: DXR - DXR RIBS RIGHT UNILATERAL  - Jan 20 2014  3:17PM     CLINICAL DATA:  Rib pain, breast cancer, bone Mets    EXAM:  RIGHT RIBS - 2 VIEW    COMPARISON:  PET-CT dated 11/18/2013    FINDINGS:  Chronic interstitial markings/emphysematous changes. No focal  consolidation. No pleural effusion or pneumothorax.  The heart is normal in size. Right chest power port terminating at  the cavoatrial junction.    Thoracolumbar spine fixation hardware.    No displaced right rib fracture is seen.    Mild sclerosis involving the right lateral 9th rib, suspicious for  osseous metastasis. Old healed fracture of the left posterior 5th  rib.     IMPRESSION:  No evidence of acute cardiopulmonary disease.    No displaced right rib fracture is seen.  Suspected osseous metastasis involving the right lateral 9th rib.      Electronically Signed    By: Julian Hy M.D.    On: 01/20/2014 16:20         Verified By: Julian Hy, M.D.,  [place]:    02-Mar-15 11:40, PET/CT Scan Breast CA Stage/Restaging  PACS Image     04-May-15 15:17, Ribs Right Unilateral  PACS Image   Nuclear Med:    02-Mar-15 11:40, PET/CT Scan Breast CA Stage/Restaging  PET/CT Scan Breast CA Stage/Restaging   REASON FOR EXAM:    History of possible breast disease  COMMENTS:       PROCEDURE: PET - PET/CT RESTG BREAST CA  - Nov 18 2013 11:40AM     CLINICAL DATA:  Subsequent treatment strategy for breast cancer.    EXAM:  NUCLEAR MEDICINE PET SKULL BASE TO THIGH    FASTING BLOOD GLUCOSE:  Value: 73  mg/dl    TECHNIQUE:  12.4 MCi F-18 FDG was injected intravenously. Full-ring PET imaging  was performed from the skull base to thigh after the radiotracer. CT  data was obtained and used for attenuation correction and anatomic  localization.    COMPARISON:  PET-CT from 06/26/2013.    FINDINGS:  NECK    No evidence for hypermetabolic lymphadenopathy in the neck.    CHEST    A left supraclavicular hypermetabolic lymph node has SUV max = 6.  This is compatible with metastatic disease.  The tip of the right Port-A-Cath is in the upper right atrium. 15 mm  left thyroid nodule is not hypermetabolic on the PET images.    ABDOMEN/PELVIS    2.5 cm ill-defined low-density lesion in the dome of the liver is  hypermetabolic. No other hypermetabolic soft tissue disease in the  abdomen or pelvis.    Small calcified stones are seen in the lumen of gallbladder.  Diverticular disease is noted in the distal colon without  diverticulitis.    SKELETON  Widespread hypermetabolic bony metastases are evident, involving the  cervical, thoracic, and lumbar spine. Sternal and rib metastases are  associated. Metastatic lesions are seen in the bony anatomic pelvis.     IMPRESSION:  Interval development of metastatic disease in the left  supraclavicular region and liver.    Numerous bony metastases involving the spine, sternum, ribs, and  bony anatomic pelvis.      Electronically Signed    By: Misty Stanley M.D.    On: 11/18/2013 14:05     Verified By: ERIC A. MANSELL,M.D.,   Relevent Results:   Relevant Scans and Labs PET CT scan and plain films are reviewed   Assessment and Plan: Impression:   progressive metastatic stage IV breast cancer with significant pain involving right lateral ribs in 79 year old female Plan:   at this time to go ahead with palliative radiation therapy to her right lateral ribs. Would treat to 3000 cGy in 10 fractions. I set her up for CT simulation tomorrow.  We'll review her case at our tumor conference for better delineation of the rib involvement. Risks and benefits of treatment including skin reactions, fatigue, possible inclusion of some superficial lung all were discussed in detail with the patient and her family. They all seem to comprehend my treatment plan well. I discussed the case personally with medical oncology.  I would like to take this opportunity for allowing me to participate in the care of your patient..  CC Referral:  cc: Dr. Emily Filbert, Dr. Nicholaus Bloom   Electronic Signatures: Baruch Gouty, Roda Shutters (MD)  (Signed 06-May-15 15:42)  Authored: HPI, Diagnosis, Past Hx, PFSH, Allergies, Home Meds, ROS, Nursing Notes, Physical Exam, Other Results, Relevent Results, Encounter Assessment and Plan, CC Referring Physician   Last Updated: 06-May-15 15:42 by Armstead Peaks (MD)

## 2015-01-10 NOTE — H&P (Signed)
PATIENT NAME:  Christine Strickland, Christine Strickland MR#:  [id number] DATE OF BIRTH:  10/19/1935  DATE OF ADMISSION:  07/07/2014  PRIMARY CARE PHYSICIAN: Rusty Aus, M.D.  ONCOLOGY: Simonne Come. Gittin, M.D.   CHIEF COMPLAINT: Shortness of breath and right-sided chest pain.   HISTORY OF PRESENT ILLNESS: [name] is a 79 year old, Caucasian female, with history of COPD, history of lobular breast carcinoma with recurrence, which has now metastasized to the right-sided rib and pelvic menostasis, which is diagnosed with recent MRI of the hip at [facility]. The patient currently has finished up her cycle of chemotherapy and is scheduled to undergo palliative radiation to her metastatic lesions. She came in today because she has been having increasing shortness of breath, pleuritic chest pain, and very fatigued and not feeling well. She was seen in the Emergency Room and was found to have right upper lobe pneumonia, which is new. She received IV vancomycin and Zosyn. She also received a dose of IV Neulasta. She received the Neulasta dose on October 12. Her white cell count, at this time, is around 50,000. She remains tachycardic with heart rate anywhere from 100 to 120s. Her saturations are 94% on 2 liters. She did have relative hypotension initially in the Emergency Room. She is being admitted with sepsis secondary to right upper lobe pneumonia.   PAST MEDICAL HISTORY: 1. As listed, recurrent metastatic breast cancer. The patient is status post lumpectomy in the past, currently completed a cycle of chemotherapy and is scheduled to undergo palliative radiation on her bony metastasis.  2. History of smoking along with history of COPD in the past.  3. Hypertension.  4. Scoliosis.  5. Essential thrombocytosis.  6. Spinal surgery.   ALLERGIES: No known drug allergies.   FAMILY HISTORY: Positive for heart disease.   SOCIAL HISTORY: Lives at home, does not smoke. Has a remote history of smoking. No alcohol use.   MEDICATIONS:   1. Amlodipine 5 mg daily.  2. Dexamethasone 2 mg daily.  3. Klor-Con 10 mEq p.o. daily.  4. Lidoderm 5% topical apply to affected area once a day.  5. Metoprolol 50 mg extended release p.o. daily.  6. Zofran 4 mg every 8 hours as needed.  7. Senna 8.6 mg 1 tablet b.i.d. as needed.  8. Tramadol 50 mg every 4 hours as needed.   REVIEW OF SYSTEMS:  CONSTITUTIONAL: No fever. Positive fatigue, weakness and chest pain.  EYES: No blurred or double vision, glaucoma or cataracts.  ENT: No tinnitus, ear pain, hearing loss or postnasal drip.  RESPIRATORY: Positive for shortness of breath, pleuritic chest pain, painful respiration, and COPD.  CARDIOVASCULAR: Positive for pleuritic chest pain. No orthopnea or edema. Positive for hypertension.  GASTROINTESTINAL: No nausea, vomiting, diarrhea, abdominal pain or hematemesis.  GENITOURINARY: No dysuria, hematuria, frequency or incontinence.  ENDOCRINE: No polyuria, nocturia or thyroid problems.  HEMATOLOGY: No anemia or easy bruising.  SKIN: No acne or rash.  MUSCULOSKELETAL: Positive for arthritis and back pain.  NEUROLOGIC: No CVA, TIA, dementia. PSYCHIATRIC: No anxiety or depression.  All other systems reviewed are negative.   PHYSICAL EXAMINATION:  GENERAL: The patient is cachectic-appearing, she is afebrile. Pulse is 108 to 110. Respirations 27, blood pressure currently is 120/66. Saturations are 94% on 2 liter nasal cannula oxygen.  HEENT: Acquired alopecia secondary to chemotherapy. Atraumatic, normocephalic. Pupils PERRLA. EOM intact. Oral mucosa is dry.  NECK: Supple. No JVD. No carotid bruit.  RESPIRATORY: The patient does have a Port-A-Cath present in the upper chest.  Decreased breath sounds. Coarse breath sounds heard. No wheezing no respiratory distress or use of accessory muscles.  CARDIOVASCULAR: Tachycardia present. No murmur heard. PMI not lateralized. Chest has tenderness present on the right-sided lateral ribs.  ABDOMEN: Soft,  benign, nontender. No organomegaly. Positive bowel sounds.  NEUROLOGIC: Grossly intact cranial nerves II through XII. No motor or sensory deficit.  PSYCHIATRIC: The patient is awake, alert, oriented x 3.  SKIN: Warm and dry.   LABORATORY AND DIAGNOSTIC DATA: Cardiac enzymes, first set negative.   Chest x-ray shows right upper lobe and left lower lobe airspace disease is concerning for infection. Tip of the right subclavian Port-A-Cath is at or just beyond the cavoatrial junction.  White count is 49.8, hemoglobin and hematocrit is 11.5 and 33.8, platelet count is 158,000. MCV is 108. Glucose is 93, BUN is 19, creatinine is 1.1, sodium is 125, potassium is 3.5, chloride is 93, bicarbonate is 22, calcium 6.2. Albumin is 1.7.   ASSESSMENT AND PLAN: [age]-year-old Ms. Christine Strickland, with history of metastatic breast cancer, hypertension, and essential thrombocytosis, comes in with:   1. Sepsis suspected due to right upper lobe and left lower lobe pneumonia. The patient is going to be placed in the stepdown unit. She appears to be tachycardic with elevated white count of 50,000 and had relative hypotension initially. She will be continued on IV vancomycin and Zosyn. Follow blood cultures, sputum cultures, if the patient is able to give sample, and urine cultures. Dr. Inez Pilgrim to see the patient while the patient is in house. Follow blood cultures and white cell count. Please note the patient did receive Neulasta on October 12.  2. Tachycardia, likely due to underlying sepsis. We will continue to monitor. We will resume metoprolol if blood pressure allows, at a low dose.  3. Hyponatremia suspected due to sepsis infection and mild dehydration. The patient received a bolus. We will continue IV fluids. Monitor sodium closely.  4. Metastatic breast cancer. The patient completed a cycle of chemotherapy. She received a dose of Neulasta, October 12. We will have Dr. Inez Pilgrim see the patient in consultation. The  patient is supposed to be scheduled to be starting palliative radiation to her bony metastatic lesions.  5. Bony metastasis pain. Continue Lidoderm patch. We will give her a  low dose of morphine alternating with tramadol.  6. Deep vein thrombosis prophylaxis, subcutaneous heparin t.i.d.   CODE STATUS: The patient is a Full Code for now. Code needs to be readdressed with family members.   CRITICAL TIME SPENT: 60 minutes.    ____________________________ Hart Rochester Posey Pronto, MD sap:JT D: 07/07/2014 14:25:54 ET T: 07/07/2014 15:53:27 ET JOB#: [id number]  cc: Maesyn Frisinger A. Posey Pronto, MD, <Dictator> Rusty Aus, MD Simonne Come. Inez Pilgrim, MD Ilda Basset MD ELECTRONICALLY SIGNED 07/31/2014 7:43

## 2015-01-10 NOTE — Discharge Summary (Signed)
PATIENT NAME:  Christine Strickland, Christine Strickland MR#:  [id number] DATE OF BIRTH:  1936/02/21  DATE OF ADMISSION:  07/07/2014 DATE OF DISCHARGE:  07/14/2014  DISCHARGE DIAGNOSES:  1.  Acute respiratory failure with sepsis.  2.  Hemodynamic instability, hypotension. 3.  Hyponatremia.  4.  Electrolyte disturbance, hypomagnesemia, hypocalcemia.  5.  Metastatic breast cancer to bones.   DISCHARGE MEDICATIONS:  1.  Ceftin 250 mg b.i.d. x 1 week.  2.  Senna 1 tab b.i.d.  3.  Toprol-XL 150 mg daily. 4.  Amlodipine 5 mg daily. 5.  Tramadol 50 mg t.i.d. p.r.n.  6.  Lidoderm 5% patch daily. 7.  Dexamethasone 2 mg daily.  8.  Potassium chloride 10 mEq daily, 9.  Nicotine patch 7 mg daily.   REASON FOR ADMISSION: A 79 year old female who presents with hypotension and hypoxemia. Please see H and P for past medical history and physical exam.   HOSPITAL COURSE: The patient was admitted to the Intensive Care Unit. Her IV Neo-Synephrine was weaned off with fluid resuscitations. She was found to have bilateral pneumonia, likely strep pneumo in light of how rapid it came on.  She was treated with IV vancomycin, IV azithromycin and IV Zosyn.  Her clinical course improved dramatically where her oxygen was weaned off over time and she was off oxygen on discharge. Chest x-rays did show improvement. Echocardiogram showed normal LV systolic function. Her sodium came up and she had aggressive replacement of magnesium and calcium. Her blood pressure medicines were resumed as her blood pressure came up; in fact, she was leaning toward hypertension. She will finish up some Ceftin. Follow up with oncology as she will be starting radiation for cancer and 10 day follow up with Dr. Sabra Heck.   Overall prognosis is guarded.     ____________________________ Rusty Aus, MD mfm:DT D: 07/14/2014 08:13:05 ET T: 07/14/2014 15:17:50 ET JOB#: [id number]  cc: Rusty Aus, MD, <Dictator> [place] MD ELECTRONICALLY SIGNED 07/14/2014  21:03

## 2015-01-18 NOTE — H&P (Signed)
PATIENT NAME:  Christine Strickland, Christine Strickland MR#:  [id number] DATE OF BIRTH:  June 06, 1936  DATE OF ADMISSION:  10/14/2014  PRIMARY CARE PHYSICIAN: Dr. Emily Filbert.   ONCOLOGIST: Dr. Grayland Ormond.   CHIEF COMPLAINT: Weakness, lethargy, and poor p.o. intake.   HISTORY OF PRESENT ILLNESS: This is a 79 year old female who presented to the hospital due to weakness, lethargy, and poor p.o. intake ongoing for the past few days. The patient has a history of metastatic breast cancer followed by Dr. Grayland Ormond. She apparently stopped treatment 6 weeks ago as her prognosis is poor. The family has been trying to get her pain under well control along with neuropathic pain. The patient has been on some Neurontin and OxyContin and oxycodone, but despite being on that her pain is not well controlled and she is more lethargic and sleeps most of the day and also hallucinating at times. The family was a bit concerned and brought to the hospital for further evaluation. In the Emergency Room the patient on routine blood work was noted to be in acute renal failure with severe dehydration and hyponatremia, also incidentally noted to have a pneumonia on chest x-ray. Hospitalist services were contacted for further treatment and evaluation. The patient admits to a cough which is productive at times. No shortness of breath. Positive nausea. No acute vomiting, no diarrhea. Positive constipation. No chest pain. Chronic back pain and no other associated symptoms presently.   REVIEW OF SYSTEMS:    CONSTITUTIONAL: No documented fever. Positive generalized fatigue and weakness. No weight gain.  EYES: No blurred or double vision.  EARS, NOSE, AND THROAT: No tinnitus. No postnasal drip. No redness of the oropharynx.  RESPIRATORY: No cough, no wheeze, no hemoptysis or dyspnea.  CARDIOVASCULAR: No chest pain, no orthopnea, no palpitations, no syncope.  GASTROINTESTINAL: Positive nausea. No vomiting. No diarrhea. No abdominal pain. No melena or hematochezia.   GENITOURINARY: No dysuria or hematuria.  ENDOCRINE: No polyuria, nocturia, heat or cold intolerance.   HEMATOLOGIC: No anemia, no bruising, no bleeding.  INTEGUMENTARY: No rashes, no lesions.  MUSCULOSKELETAL: No arthritis. No swelling. No gout.  NEUROLOGIC: No numbness. No ataxia. No seizure activity.  PSYCHIATRIC: No anxiety. No insomnia. No ADD.   PAST MEDICAL HISTORY: Consistent with metastatic breast cancer, hypertension, chronic pain, neuropathic pain.   ALLERGIES: No known drug allergies.   SOCIAL HISTORY: Still smokes a few cigarettes per day, has been smoking on and off about 30-40 years. No alcohol abuse. No illicit drug abuse. Lives at home with her sons.   FAMILY HISTORY: Mother and father both deceased. Mother died from an MI.  Father died from a drowning incident.   CURRENT MEDICATIONS: Amlodipine 5 mg daily, dexamethasone 4 mg half a tab daily as needed, Colace 100 mg b.i.d. as needed, gabapentin 300 mg t.i.d., Toprol 50 mg daily, MS Contin 15 mg daily, Zofran 4 mg q. 8 hours as needed, oxycodone 10 mg at bedtime as needed, oxycodone 5 mg q. 4 hours as needed, and tramadol 50 mg at bedtime as needed.   PHYSICAL EXAMINATION:  VITAL SIGNS: Temperature 97.8, pulse 103, respirations 17, blood pressure 122/97, saturations 97% on room air.  GENERAL: She is a cachectic-appearing female, but in no apparent distress, but lethargic.  HEAD, EYES, EARS, NOSE, AND THROAT: Atraumatic, normocephalic. Extraocular muscles are intact. Pupils equal and reactive to light. Sclerae are anicteric. No conjunctival injection. No oropharyngeal erythema. Positive dry oral mucosa.  NECK: Supple. There is no jugular venous distention. No bruits. No lymphadenopathy  or thyromegaly.  HEART: Regular rate and rhythm, tachycardic. No murmurs, no rubs, no clicks.  LUNGS: Clear to auscultation bilaterally. No rales or rhonchi. No wheezes.  ABDOMEN: Soft, flat, nontender, nondistended. Has good bowel sounds. No  hepatosplenomegaly appreciated.  EXTREMITIES: No evidence of any cyanosis, clubbing. She does have + 1 pitting edema from knees to the ankles bilaterally, + 2 pedal and radial pulses bilaterally.  NEUROLOGICAL: The patient is alert, awake, and oriented x 2. No focal motor or sensory deficits appreciated bilaterally.  SKIN: Moist and warm with no rashes.  LYMPHATIC: There is no cervical or axillary lymphadenopathy.   LABORATORY DATA: Serum glucose of 78, BUN 44, creatinine 2.06, sodium 128, potassium 5.8, chloride 97, bicarbonate 21. The patient's total protein 5.5, albumin 1.7, AST 141, ALT 29. Troponin less than 0.02. White cell count is 15.0, hemoglobin 8.7, hematocrit 26.8, platelet count 223,000, MCV 102. Troponin less than 0.02. The patient did have a chest x-ray done which showed findings felt to represent metastatic disease, consolidation in the medial left base suspicious for pneumonia, there may be a second focus of pneumonia in the medial aspect of the right upper lobe.   ASSESSMENT AND PLAN: This is a 79 year old female with history of metastatic breast cancer, hypertension, chronic pain, neuropathic pain due to chemotherapy, presented to the hospital due to weakness, lethargy, poor p.o. intake, noted to be severely dehydrated with acute renal failure, hyponatremia, and also noted to have a pneumonia.   1.  Acute renal failure. This is likely due to severe dehydration and poor p.o. intake. I will aggressively hydrate the patient with IV fluids, follow BUN and creatinine and urine output, renal dose medications, avoid nephrotoxins.  2.  Hyperkalemia. I suspect this is probably related to the dehydration and acute renal failure. No EKG changes consistent with hyperkalemia. This should improve as her renal function corrects. 3.  Hyponatremia, likely hypovolemic hypotonic hyponatremia secondary to dehydration and poor p.o. intake. I will hydrate her with IV fluids, follow her sodium.  4.   Pneumonia, likely community-acquired pneumonia as noted on the chest x-ray.  I will treat her with IV ceftriaxone and Zithromax, follow cultures sputum and blood.  5.  Leukocytosis. This is probably secondary to pneumonia. I will follow the white cell count with IV antibiotic therapy.  6.  Metastatic breast cancer. The patient follows with Dr. Grayland Ormond. Her prognosis is fairly poor. The patient has stopped her treatment 6 weeks ago. I will get a palliative care consult to discuss goals of care with the family.  7.  Chronic pain. Continue OxyContin and morphine IV.  8.  Neuropathy. Continue Neurontin.   CODE STATUS: The patient is a DNI, DNR. The patient will be transferred to Dr. Myrtis Ser service.   TIME SPENT: 50 minutes.    ____________________________ Belia Heman. Verdell Carmine, MD vjs:bu D: 10/14/2014 19:15:57 ET T: 10/14/2014 20:00:19 ET JOB#: [id number]  cc: Belia Heman. Verdell Carmine, MD, <Dictator> Henreitta Leber MD ELECTRONICALLY SIGNED 11/01/2014 12:48

## 2015-01-18 NOTE — Discharge Summary (Signed)
PATIENT NAME:  Christine Strickland, Christine Strickland MR#:  [id number] DATE OF BIRTH:  1936-05-31  DATE OF ADMISSION:  10/14/2014 DATE OF DISCHARGE:  10/17/2014  DISCHARGE DIAGNOSES: 1. Community-acquired pneumonia.  2. Dehydration with hypotension and acute renal insufficiency.  3. Metastatic breast cancer.  4. Chronic neuropathic pain.   DISCHARGE MEDICATIONS: Oxycodone 5 mg q. 4 p.r.n. pain, tramadol 50 mg daily p.r.n. pain, gabapentin 300 mg t.i.d., oxycodone 10 mg at bedtime, Ceftin 250 mg b.i.d., Ensure b.i.d., hydrochlorothiazide 12.5 mg daily p.r.n. edema.   REASON FOR ADMISSION: A 79 year old female who presents with hypotension and dehydration. Please see H and P for HPI, past medical history, and physical exam.   HOSPITAL COURSE: The patient was admitted, hydrated, treated with IV azithromycin and Rocephin. She has not had chemotherapy for 4-6 weeks in light of her progressive metastatic breast cancer and failure to respond. Hospice was consulted and she will be going home with hospice. Overall, prognosis is terminal. Her blood pressure medicines were discontinued due to hypotension, and the hydrochlorothiazide will only be p.r.n. for edema    ____________________________ Rusty Aus, MD [id number] D: 10/17/2014 08:16:58 ET T: 10/17/2014 12:47:05 ET JOB#: [id number]  cc: Rusty Aus, MD, <Dictator> MARK Roselee Culver MD ELECTRONICALLY SIGNED 10/28/2014 13:19

## 2015-01-18 NOTE — Consult Note (Signed)
Note Type Consult   Subjective: Chief Complaint/Diagnosis:   Stage IV triple negative breast cancer liver, bone, and possible pulmonary metastasis now with increasing weakness and fatigue and declining performance status.                                                                                                                          HPI:   Patient admitted to the hospital with increasing weakness and fatigue and declining performance status. She is lethargic and arousable, but much of the history is given by her son who is at the bedside. She has had minimal PO intake over the past week. He feels her pain is better controlled.  She has no neurologic complaints. She denies any chest pain, shortness breath, cough, or hemoptysis. She has no vomiting, constipation, or diarrhea.    Review of Systems:  Performance Status (ECOG): 4  Pain ?: No complaints (0, none)  Emotional well-being: None  Review of Systems:   As per HPI. Otherwise, 10 point system review was negative.     Allergies:  No Known Allergies:   Preventive Screening:  Has patient had any of the following test? Colonscopy  Mammography   Last Colonoscopy: >5 years   Last Mammography: In Nov of 2012   Smoking History: Smoking History States that she smokes occasionally; "it's so hard to quit.".  PFSH: Additional Past Medical and Surgical History: COPD, hypertension, essential thrombocytosis, lobular breast cancer, hip fracture, spinal surgery.    Family history: CAD.    Social history: Patient denies alcohol, continues to smoke minimal tobacco, unclear how much. Patient was given smoking cessation counseling.   Home Medications: Medication Instructions Last Modified Date/Time  ondansetron 4 mg oral tablet 1 tab(s) orally every 8 hours, As Needed - for Nausea, Vomiting 26-Jan-16 18:32  MS Contin 15 mg/12 hr oral tablet, extended release 1 tab(s) orally once a day 26-Jan-16 18:32  oxyCODONE 5 mg oral tablet 1  tab(s) orally every 4 hours, As Needed for pain 26-Jan-16 18:32  metoprolol succinate 50 mg oral tablet, extended release 1 tab(s) orally once a day 26-Jan-16 18:32  amLODIPine 5 mg oral tablet 1 tab(s) orally once a day 26-Jan-16 18:32  traMADol 50 mg oral tablet 1 tab(s) orally once a day (at bedtime), As Needed 26-Jan-16 18:32  gabapentin 300 mg oral capsule 1 cap(s) orally 3 times a day 26-Jan-16 18:32  dexamethasone 4 mg oral tablet 0.5 tab(s) orally once a day, As Needed 26-Jan-16 18:32  oxyCODONE 10 mg oral tablet 1 tab(s) orally once a day (at bedtime), As Needed 26-Jan-16 18:32  docusate sodium sodium 100 mg oral capsule 1 cap(s) orally 2 times a day, As Needed 26-Jan-16 18:32   Vital Signs:  :: vital signs stable, patient afebrile.   Physical Exam:  General: thin, no acute distress  Mental Status: lethargic, but arousable  Eyes: anicteric sclera  Respiratory: clear to auscultation bilaterally  Cardiovascular: regular rate and rhythm, no murmur, rub,  or gallop  Gastrointestinal: soft, nondistended, nontender, no organomegaly.  normal active bowel sounds  Musculoskeletal: No edema  Skin: No rash or petechiae noted  Neurological: lethargic   Laboratory Results: Routine Chem:  27-Jan-16 04:05   Glucose, Serum 70  BUN  40  Creatinine (comp)  1.95  Sodium, Serum  129  Potassium, Serum 5.1  Chloride, Serum 99  CO2, Serum  19  Calcium (Total), Serum  7.3  Anion Gap 11  Osmolality (calc) 267  eGFR (African American)  32  eGFR (Non-African American)  27 (eGFR values <31m/min/1.73 m2 may be an indication of chronic kidney disease (CKD). Calculated eGFR, using the MRDR Study equation, is useful in  patients with stable renal function. The eGFR calculation will not be reliable in acutely ill patients when serum creatinine is changing rapidly. It is not useful in patients on dialysis. The eGFR calculation may not be applicable to patients at the low and high extremes of body  sizes, pregnant women, and vegetarians.)  Routine Hem:  27-Jan-16 04:05   WBC (CBC)  13.1  RBC (CBC)  2.79  Hemoglobin (CBC)  9.0  Hematocrit (CBC)  28.9  Platelet Count (CBC) 209  MCV  104  MCH 32.3  MCHC  31.1  RDW  18.4  Neutrophil % 86.0  Lymphocyte % 7.2  Monocyte % 5.5  Eosinophil % 1.0  Basophil % 0.3  Neutrophil #  11.3  Lymphocyte #  0.9  Monocyte # 0.7  Eosinophil # 0.1  Basophil # 0.0 (Result(s) reported on 15 Oct 2014 at 05:38AM.)   Assessment and Plan: Impression:   Stage IV triple negative breast cancer now with declining performance status Plan:   1. Breast cancer: Patient previously elected to discontinue treatments. Her performance status is declining and she has agreed to hospice at home. Appreciate palliative care input.   Pain: Continue current narcotic regimen.Dehydration/nausea: Continue IV fluids. Hospice as above.  Fax to Physician:  Physicians To Recieve Fax: MRusty Aus MD - [phone]  Advance Directive:  Advance Directive (Theatre stage manager no   Advance Directive Information Given yes   Adv Dir Follow-up Patient does not desire follow-up with Chaplain regarding Advance Directives   Electronic Signatures: FDelight Hoh(MD)  (Signed 27-Jan-16 16:32)  Authored: Note Type, CC/HPI, Review of Systems, ALLERGIES, Preventive Screening, Smoking Cessation, Patient Family Social History, HOME MEDICATIONS, Vital Signs, Physical Exam, Lab Results Review, Assessment and Plan, Fax to Physician, Advance Directive   Last Updated: 27-Jan-16 16:32 by FDelight Hoh(MD)

## 2015-09-14 IMAGING — CT NM PET TUM IMG RESTAG (PS) SKULL BASE T - THIGH
1 of 5 series · 1 of 25 positions shown · non-contrast
Comparison: none

REASON FOR EXAM: restaging breast CA
COMMENTS:

[Series 4: pet wb (ac) · axial · 5.0mm · 4.07mm/px · 1 of 251 slices shown]
[im 126/251]
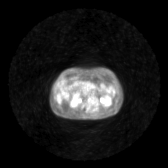

[1 of 25 positions shown; findings below may reference images not displayed]

PROCEDURE:     PET - PET/CT RESTG BREAST CA  - June 26, 2013 [DATE]

RESULT:     The patient has a fasting blood glucose level measuring 123
mg/dL. The patient received a dose of 12.62 mCi of fluorine 18 labeled
fluorodeoxyglucose in the left forearm at [DATE] a.m. Imaging is performed
from the base the brain into the thighs between [DATE] a.m. and [DATE] a.m.
Low-dose noncontrast CT is performed over the same regions for the purposes
of attenuation correction and fusion. The low-dose noncontrast CT,
attenuation corrected at images and fused PET/CT data are reconstructed in
the axial, coronal and sagittal planes by the [employer] software which also
reconstruction a rotating 3-dimensional PET image. Comparison is made to
images from the previous PET dated 11/06/2012.

The focal areas of abnormal localization over the axillary and
supraclavicular regions previously are not evident on today's exam. The left
breast abnormal localization seen previously is not evident. There is a
Port-A-Cath device present over the right chest. There is no pleural or
pericardial effusion. There is a calcific density in the caudate lobe of the
liver. There is artifact from the patient's spinal hardware. GI and GU
localization is normal.
IMPRESSION: 1. No focal abnormality evident. No findings to suggest residual or
recurrent malignant disease. Continued followup is recommended.

[REDACTED]
